# Patient Record
Sex: Female | Born: 1935 | Race: White | Hispanic: No | Smoking: Never smoker
Health system: Southern US, Community
[De-identification: ages and names within clinical notes are randomized; demographics above are authoritative.]

## PROBLEM LIST (undated history)

## (undated) DIAGNOSIS — E119 Type 2 diabetes mellitus without complications: Secondary | ICD-10-CM

## (undated) HISTORY — PX: ABDOMINAL HYSTERECTOMY: SHX81

---

## 2005-03-14 ENCOUNTER — Ambulatory Visit: Payer: Self-pay | Admitting: Family Medicine

## 2005-03-19 ENCOUNTER — Ambulatory Visit: Payer: Self-pay | Admitting: Family Medicine

## 2005-04-12 ENCOUNTER — Ambulatory Visit: Payer: Self-pay | Admitting: Gastroenterology

## 2005-07-09 ENCOUNTER — Ambulatory Visit: Payer: Self-pay | Admitting: General Surgery

## 2007-04-21 ENCOUNTER — Ambulatory Visit: Payer: Self-pay | Admitting: Family Medicine

## 2008-04-21 ENCOUNTER — Ambulatory Visit: Payer: Self-pay | Admitting: Family Medicine

## 2008-08-08 ENCOUNTER — Ambulatory Visit: Payer: Self-pay | Admitting: Family Medicine

## 2008-08-25 ENCOUNTER — Ambulatory Visit: Payer: Self-pay | Admitting: Family Medicine

## 2008-10-18 ENCOUNTER — Ambulatory Visit: Payer: Self-pay | Admitting: Ophthalmology

## 2008-10-31 ENCOUNTER — Ambulatory Visit: Payer: Self-pay | Admitting: Ophthalmology

## 2008-12-19 ENCOUNTER — Ambulatory Visit: Payer: Self-pay | Admitting: Ophthalmology

## 2009-07-20 ENCOUNTER — Ambulatory Visit: Payer: Self-pay | Admitting: Family Medicine

## 2009-11-21 ENCOUNTER — Emergency Department: Payer: Self-pay | Admitting: Emergency Medicine

## 2010-09-20 ENCOUNTER — Ambulatory Visit: Payer: Self-pay | Admitting: Family Medicine

## 2011-10-24 ENCOUNTER — Ambulatory Visit: Payer: Self-pay | Admitting: Family Medicine

## 2012-09-19 ENCOUNTER — Emergency Department: Payer: Self-pay | Admitting: Internal Medicine

## 2012-09-19 LAB — COMPREHENSIVE METABOLIC PANEL
Anion Gap: 11 (ref 7–16)
BUN: 9 mg/dL (ref 7–18)
Calcium, Total: 8.8 mg/dL (ref 8.5–10.1)
Co2: 25 mmol/L (ref 21–32)
EGFR (African American): >60
EGFR (Non-African Amer.): >60
Glucose: 200 mg/dL — ABNORMAL HIGH (ref 65–99)
Osmolality: 258 (ref 275–301)
Potassium: 4.3 mmol/L (ref 3.5–5.1)
SGOT(AST): 14 U/L — ABNORMAL LOW (ref 15–37)
SGPT (ALT): 19 U/L (ref 12–78)
Sodium: 126 mmol/L — ABNORMAL LOW (ref 136–145)
Total Protein: 7.2 g/dL (ref 6.4–8.2)

## 2012-09-19 LAB — CBC
HCT: 35.9 % (ref 35.0–47.0)
MCH: 29.5 pg (ref 26.0–34.0)
MCV: 86 fL (ref 80–100)
Platelet: 256 10*3/uL (ref 150–440)
RDW: 12.4 % (ref 11.5–14.5)
WBC: 4.8 10*3/uL (ref 3.6–11.0)

## 2012-09-19 LAB — TROPONIN I: Troponin-I: <0.02 ng/mL

## 2012-09-19 LAB — CK TOTAL AND CKMB (NOT AT ARMC): CK-MB: 0.7 ng/mL (ref 0.5–3.6)

## 2012-11-11 ENCOUNTER — Ambulatory Visit: Payer: Self-pay | Admitting: Family Medicine

## 2013-11-15 ENCOUNTER — Ambulatory Visit: Payer: Self-pay | Admitting: Family Medicine

## 2014-11-16 ENCOUNTER — Ambulatory Visit: Payer: Self-pay | Admitting: Family Medicine

## 2020-09-04 ENCOUNTER — Inpatient Hospital Stay
Admission: EM | Admit: 2020-09-04 | Discharge: 2020-09-08 | DRG: 637 | Disposition: A | Discharge status: Skilled Nursing Facility | Payer: MEDICARE | Attending: Internal Medicine | Admitting: Internal Medicine

## 2020-09-04 ENCOUNTER — Emergency Department: Payer: MEDICARE

## 2020-09-04 ENCOUNTER — Encounter: Payer: Self-pay | Admitting: Emergency Medicine

## 2020-09-04 ENCOUNTER — Other Ambulatory Visit: Payer: Self-pay

## 2020-09-04 DIAGNOSIS — N179 Acute kidney failure, unspecified: Secondary | ICD-10-CM

## 2020-09-04 DIAGNOSIS — I10 Essential (primary) hypertension: Secondary | ICD-10-CM

## 2020-09-04 DIAGNOSIS — E1110 Type 2 diabetes mellitus with ketoacidosis without coma: Secondary | ICD-10-CM | POA: Diagnosis not present

## 2020-09-04 DIAGNOSIS — E1122 Type 2 diabetes mellitus with diabetic chronic kidney disease: Secondary | ICD-10-CM | POA: Diagnosis present

## 2020-09-04 DIAGNOSIS — R410 Disorientation, unspecified: Secondary | ICD-10-CM

## 2020-09-04 DIAGNOSIS — E785 Hyperlipidemia, unspecified: Secondary | ICD-10-CM | POA: Diagnosis present

## 2020-09-04 DIAGNOSIS — I129 Hypertensive chronic kidney disease with stage 1 through stage 4 chronic kidney disease, or unspecified chronic kidney disease: Secondary | ICD-10-CM | POA: Diagnosis present

## 2020-09-04 DIAGNOSIS — I6381 Other cerebral infarction due to occlusion or stenosis of small artery: Secondary | ICD-10-CM | POA: Diagnosis present

## 2020-09-04 DIAGNOSIS — E1165 Type 2 diabetes mellitus with hyperglycemia: Secondary | ICD-10-CM | POA: Diagnosis present

## 2020-09-04 DIAGNOSIS — E1310 Other specified diabetes mellitus with ketoacidosis without coma: Secondary | ICD-10-CM

## 2020-09-04 DIAGNOSIS — Z7984 Long term (current) use of oral hypoglycemic drugs: Secondary | ICD-10-CM

## 2020-09-04 DIAGNOSIS — N1832 Chronic kidney disease, stage 3b: Secondary | ICD-10-CM | POA: Diagnosis present

## 2020-09-04 DIAGNOSIS — G9341 Metabolic encephalopathy: Secondary | ICD-10-CM | POA: Diagnosis present

## 2020-09-04 DIAGNOSIS — E119 Type 2 diabetes mellitus without complications: Secondary | ICD-10-CM

## 2020-09-04 DIAGNOSIS — Z20822 Contact with and (suspected) exposure to covid-19: Secondary | ICD-10-CM | POA: Diagnosis present

## 2020-09-04 HISTORY — DX: Type 2 diabetes mellitus without complications: E11.9

## 2020-09-04 LAB — URINALYSIS, ROUTINE W REFLEX MICROSCOPIC
Bacteria, UA: NONE SEEN
Bilirubin Urine: NEGATIVE
Glucose, UA: >=500 mg/dL — AB
Hgb urine dipstick: NEGATIVE
Ketones, ur: 80 mg/dL — AB
Leukocytes,Ua: NEGATIVE
Nitrite: NEGATIVE
Protein, ur: NEGATIVE mg/dL
Specific Gravity, Urine: 1.022 (ref 1.005–1.030)
pH: 5 (ref 5.0–8.0)

## 2020-09-04 LAB — COMPREHENSIVE METABOLIC PANEL
ALT: 15 U/L (ref 0–44)
AST: 16 U/L (ref 15–41)
Albumin: 4.1 g/dL (ref 3.5–5.0)
Alkaline Phosphatase: 80 U/L (ref 38–126)
Anion gap: 16 — ABNORMAL HIGH (ref 5–15)
BUN: 31 mg/dL — ABNORMAL HIGH (ref 8–23)
CO2: 21 mmol/L — ABNORMAL LOW (ref 22–32)
Calcium: 9.6 mg/dL (ref 8.9–10.3)
Chloride: 87 mmol/L — ABNORMAL LOW (ref 98–111)
Creatinine, Ser: 1.1 mg/dL — ABNORMAL HIGH (ref 0.44–1.00)
GFR, Estimated: 45 mL/min — ABNORMAL LOW (ref >60)
Glucose, Bld: 662 mg/dL (ref 70–99)
Potassium: 5.4 mmol/L — ABNORMAL HIGH (ref 3.5–5.1)
Sodium: 124 mmol/L — ABNORMAL LOW (ref 135–145)
Total Bilirubin: 1.1 mg/dL (ref 0.3–1.2)
Total Protein: 7.2 g/dL (ref 6.5–8.1)

## 2020-09-04 LAB — GLUCOSE, CAPILLARY
Glucose-Capillary: 106 mg/dL — ABNORMAL HIGH (ref 70–99)
Glucose-Capillary: 142 mg/dL — ABNORMAL HIGH (ref 70–99)
Glucose-Capillary: 152 mg/dL — ABNORMAL HIGH (ref 70–99)
Glucose-Capillary: 193 mg/dL — ABNORMAL HIGH (ref 70–99)
Glucose-Capillary: 206 mg/dL — ABNORMAL HIGH (ref 70–99)
Glucose-Capillary: 233 mg/dL — ABNORMAL HIGH (ref 70–99)
Glucose-Capillary: 399 mg/dL — ABNORMAL HIGH (ref 70–99)
Glucose-Capillary: 520 mg/dL (ref 70–99)
Glucose-Capillary: >600 mg/dL (ref 70–99)

## 2020-09-04 LAB — CBC
HCT: 34.5 % — ABNORMAL LOW (ref 36.0–46.0)
Hemoglobin: 11.7 g/dL — ABNORMAL LOW (ref 12.0–15.0)
MCH: 29.7 pg (ref 26.0–34.0)
MCHC: 33.9 g/dL (ref 30.0–36.0)
MCV: 87.6 fL (ref 80.0–100.0)
Platelets: 241 10*3/uL (ref 150–400)
RBC: 3.94 MIL/uL (ref 3.87–5.11)
RDW: 11.9 % (ref 11.5–15.5)
WBC: 5.8 10*3/uL (ref 4.0–10.5)
nRBC: 0 % (ref 0.0–0.2)

## 2020-09-04 LAB — BLOOD GAS, VENOUS
Acid-base deficit: 5 mmol/L — ABNORMAL HIGH (ref 0.0–2.0)
Bicarbonate: 21.6 mmol/L (ref 20.0–28.0)
O2 Saturation: 50.9 %
Patient temperature: 37
pCO2, Ven: 45 mmHg (ref 44.0–60.0)
pH, Ven: 7.29 (ref 7.250–7.430)
pO2, Ven: 31 mmHg — CL (ref 32.0–45.0)

## 2020-09-04 LAB — TROPONIN I (HIGH SENSITIVITY)
Troponin I (High Sensitivity): 11 ng/L (ref <18)
Troponin I (High Sensitivity): 12 ng/L (ref <18)

## 2020-09-04 LAB — RESPIRATORY PANEL BY RT PCR (FLU A&B, COVID)
Influenza A by PCR: NEGATIVE
Influenza B by PCR: NEGATIVE
SARS Coronavirus 2 by RT PCR: NEGATIVE

## 2020-09-04 LAB — BASIC METABOLIC PANEL
Anion gap: 16 — ABNORMAL HIGH (ref 5–15)
BUN: 20 mg/dL (ref 8–23)
CO2: 22 mmol/L (ref 22–32)
Calcium: 8.8 mg/dL — ABNORMAL LOW (ref 8.9–10.3)
Chloride: 97 mmol/L — ABNORMAL LOW (ref 98–111)
Creatinine, Ser: 0.62 mg/dL (ref 0.44–1.00)
GFR, Estimated: >60 mL/min (ref >60)
Glucose, Bld: 102 mg/dL — ABNORMAL HIGH (ref 70–99)
Potassium: 3.5 mmol/L (ref 3.5–5.1)
Sodium: 135 mmol/L (ref 135–145)

## 2020-09-04 LAB — MAGNESIUM: Magnesium: 2.2 mg/dL (ref 1.7–2.4)

## 2020-09-04 LAB — BETA-HYDROXYBUTYRIC ACID: Beta-Hydroxybutyric Acid: 4.21 mmol/L — ABNORMAL HIGH (ref 0.05–0.27)

## 2020-09-04 MED ORDER — AMLODIPINE BESYLATE 5 MG PO TABS
5.0000 mg | ORAL_TABLET | Freq: Every day | ORAL | Status: DC
  Administered 2020-09-04 – 2020-09-08 (×5): 5 mg via ORAL
  Filled 2020-09-04 (×5): qty 1

## 2020-09-04 MED ORDER — LACTATED RINGERS IV SOLN: INTRAVENOUS | Status: DC

## 2020-09-04 MED ORDER — SIMVASTATIN 20 MG PO TABS
20.0000 mg | ORAL_TABLET | Freq: Every day | ORAL | Status: DC
  Administered 2020-09-04 – 2020-09-08 (×5): 20 mg via ORAL
  Filled 2020-09-04 (×2): qty 1
  Filled 2020-09-04 (×2): qty 2
  Filled 2020-09-04: qty 1

## 2020-09-04 MED ORDER — INSULIN REGULAR(HUMAN) IN NACL 100-0.9 UT/100ML-% IV SOLN
INTRAVENOUS | Status: DC
  Administered 2020-09-04: 7 [IU]/h via INTRAVENOUS
  Filled 2020-09-04: qty 100

## 2020-09-04 MED ORDER — LACTATED RINGERS IV BOLUS
1000.0000 mL | Freq: Once | INTRAVENOUS | Status: AC
  Administered 2020-09-04: 1000 mL via INTRAVENOUS

## 2020-09-04 MED ORDER — DEXTROSE 50 % IV SOLN: 0.0000 mL | INTRAVENOUS | Status: DC | PRN

## 2020-09-04 MED ORDER — ENOXAPARIN SODIUM 40 MG/0.4ML ~~LOC~~ SOLN
40.0000 mg | SUBCUTANEOUS | Status: DC
  Administered 2020-09-04 – 2020-09-07 (×4): 40 mg via SUBCUTANEOUS
  Filled 2020-09-04 (×3): qty 0.4

## 2020-09-04 MED ORDER — DEXTROSE IN LACTATED RINGERS 5 % IV SOLN: INTRAVENOUS | Status: DC

## 2020-09-04 MED ORDER — LACTATED RINGERS IV BOLUS
20.0000 mL/kg | Freq: Once | INTRAVENOUS | Status: AC
  Administered 2020-09-04: 952 mL via INTRAVENOUS

## 2020-09-04 NOTE — ED Notes (Signed)
Pt placed on 2L [place]

## 2020-09-04 NOTE — ED Triage Notes (Signed)
Pt in via EMS from home with c/o increased confusion over the last week. Pt family lives in [place], business card for pastor for contact.  fsbs 595, takes metformin only, HR 80's, 150/80, hx of HTN, has not taken meds today

## 2020-09-04 NOTE — ED Provider Notes (Signed)
[facility] Emergency Department Provider Note    ____________________________________________   I have reviewed the triage vital signs and the nursing notes.   HISTORY  Chief Complaint Altered Mental Status   History limited by: Not Limited   HPI Kristi Payne is a 86 y.o. female who presents to the emergency department today because of concern for altered mental status. Out of state family noticed that the patient did not sound like herself yesterday. They are concerned that she might not have been herself for the past few days. Patient does state that she might have forgotten to take her diabetes medications yesterday. She denies any recent illness. Denies any shortness of breath or cough. Denies any change in urination.    Records reviewed. Per medical record review patient has a history of DM  Past Medical History:  Diagnosis Date  . Diabetes mellitus without complication (HCC)     There are no problems to display for this patient.   Past Surgical History:  Procedure Laterality Date  . ABDOMINAL HYSTERECTOMY      Prior to Admission medications   Medication Sig Start Date End Date Taking? Authorizing Provider  metFORMIN (GLUCOPHAGE) 1000 MG tablet Take 1,000 mg by mouth 2 (two) times daily. 08/14/20  Yes [provider]    Allergies Penicillins  No family history on file.  Social History Social History   Tobacco Use  . Smoking status: Never Smoker  . Smokeless tobacco: Never Used  Substance Use Topics  . Alcohol use: Yes    Comment: Socially  . Drug use: Not Currently    Review of Systems Constitutional: No fever/chills Eyes: No visual changes. ENT: No sore throat. Cardiovascular: Denies chest pain. Respiratory: Denies shortness of breath. Gastrointestinal: No abdominal pain.  No nausea, no vomiting.  No diarrhea.   Genitourinary: Negative for dysuria. Musculoskeletal: Negative for back pain. Skin: Negative for  rash. Neurological: Positive for confusion.  ____________________________________________   PHYSICAL EXAM:  VITAL SIGNS: ED Triage Vitals  Enc Vitals Group     BP 09/04/20 1121 (!) 150/53     Pulse Rate 09/04/20 1121 82     Resp 09/04/20 1121 18     Temp 09/04/20 1121 98.2 F (36.8 C)     Temp Source 09/04/20 1121 Oral     SpO2 09/04/20 1121 95 %     Weight 09/04/20 1122 105 lb (47.6 kg)     Height 09/04/20 1122 5' (1.524 m)     Head Circumference --      Peak Flow --      Pain Score 09/04/20 1132 0   Constitutional: Alert and oriented.  Eyes: Conjunctivae are normal.  ENT      Head: Normocephalic and atraumatic.      Nose: No congestion/rhinnorhea.      Mouth/Throat: Mucous membranes are moist.      Neck: No stridor. Hematological/Lymphatic/Immunilogical: No cervical lymphadenopathy. Cardiovascular: Normal rate, regular rhythm.  No murmurs, rubs, or gallops.  Respiratory: Normal respiratory effort without tachypnea nor retractions. Breath sounds are clear and equal bilaterally. No wheezes/rales/rhonchi. Gastrointestinal: Soft and non tender. No rebound. No guarding.  Genitourinary: Deferred Musculoskeletal: Normal range of motion in all extremities. No lower extremity edema. Neurologic:  Normal speech and language. No gross focal neurologic deficits are appreciated.  Skin:  Skin is warm, dry and intact. No rash noted. Psychiatric: Mood and affect are normal. Speech and behavior are normal. Patient exhibits appropriate insight and judgment.  ____________________________________________  LABS (pertinent positives/negatives)  Beta-hydroxybutyric acid 4.21 VBG pH 7.29 CMP na 124, k 5.4, glu 662, cr 1.10 CBC wbc 5.8, hgb 11.7, plt 241  ____________________________________________   EKG  None  ____________________________________________    RADIOLOGY  CT head Lacunar infarct that is subacute or chronic  CXR Pulmonary  nodule  ____________________________________________   PROCEDURES  Procedures  CRITICAL CARE Performed by: Phineas Semen   Total critical care time: 25 minutes  Critical care time was exclusive of separately billable procedures and treating other patients.  Critical care was necessary to treat or prevent imminent or life-threatening deterioration.  Critical care was time spent personally by me on the following activities: development of treatment plan with patient and/or surrogate as well as nursing, discussions with consultants, evaluation of patient's response to treatment, examination of patient, obtaining history from patient or surrogate, ordering and performing treatments and interventions, ordering and review of laboratory studies, ordering and review of radiographic studies, pulse oximetry and re-evaluation of patient's condition.  ____________________________________________   INITIAL IMPRESSION / ASSESSMENT AND PLAN / ED COURSE  Pertinent labs & imaging results that were available during my care of the patient were reviewed by me and considered in my medical decision making (see chart for details).   Patient presented to the emergency department today because of concerns for altered mental status noted by family over the telephone.  On exam patient is awake and alert.  She is in no distress.  Blood work however is concerning for possible DKA.  Patient has high blood sugar, elevated anion gap, pH of 7.29 and elevated ketones.  Because of this I do feel patient would benefit from admission and IV insulin.  Discussed this finding with the patient and daughter over the telephone. ____________________________________________   FINAL CLINICAL IMPRESSION(S) / ED DIAGNOSES  Final diagnoses:  Diabetic ketoacidosis without coma associated with other specified diabetes mellitus (HCC)  Confusion     Note: This dictation was prepared with Dragon dictation. Any transcriptional  errors that result from this process are unintentional     Phineas Semen, MD 09/04/20 [id number]

## 2020-09-04 NOTE — ED Triage Notes (Signed)
Pt presents to ED via [facility] with c/o increasing confusion. Pt alert oriented to self, situation, place, disoriented to time. Per EMS CBG over 500. Pt appears in NAD while in triage. Denies pain at this time. Pt states hx of DM at this time.   In triage CBG reading "HI".

## 2020-09-04 NOTE — ED Triage Notes (Signed)
Kristi Payne business card information.  [name] [phone]

## 2020-09-04 NOTE — H&P (Signed)
History and Physical    Kristi Payne WUJ:[id number] DOB: 15-Mar-1930 DOA: 09/04/2020  PCP: Jerl Mina, MD  Patient coming from: Home  I have personally briefly reviewed patient's old medical records in [facility]  Chief Complaint: Altered mental status  HPI: Kristi Payne is a 86 y.o. female with medical history significant for uncontrolled type 2 diabetes, hypertension hyperlipidemia who presents with concerns of altered mental status.  Reportedly patient was brought in by her pastor for concerns of altered mental status.  Patient reports that she has only been feeling dizzy for the past few days and has been feeling unsteady.  She denies any nausea, vomiting or diarrhea.  Denies any decreased p.o. intake.  States she has been compliant with her medication.  Denies any tobacco, alcohol or illicit drug use.  ED Course:  Afebrile, normotensive on room air.  Blood glucose notably elevated to 662, anion gap 16, pH of 7.29, bicarb of 21.  Has an AKI with creatinine of 1.10. No leukocytosis with mild anemia with hemoglobin of 1.7.  Review of Systems: Constitutional: No Weight Change, No Fever ENT/Mouth: No sore throat, No Rhinorrhea Eyes: No Eye Pain, No Vision Changes Cardiovascular: No Chest Pain, no SOB Respiratory: No Cough, No Sputum Gastrointestinal: No Nausea, No Vomiting Genitourinary: no Urinary Incontinence, No Urgency, No Flank Pain Musculoskeletal: No Arthralgias, No Myalgias Skin: No Skin Lesions, No Pruritus, Neuro: no Weakness, No Numbness,  No Loss of Consciousness, No Syncope Psych: No Anxiety/Panic, No Depression, no decrease appetite Heme/Lymph: No Bruising, No Bleeding  Past Medical History:  Diagnosis Date  . Diabetes mellitus without complication [facility])     Past Surgical History:  Procedure Laterality Date  . ABDOMINAL HYSTERECTOMY       reports that she has never smoked. She has never used smokeless tobacco. She reports current alcohol use. She  reports previous drug use. Social History  Allergies  Allergen Reactions  . Penicillins     Other reaction(s): Unknown    No family history on file.   Prior to Admission medications   Medication Sig Start Date End Date Taking? Authorizing Provider  amLODipine (NORVASC) 5 MG tablet Take 5 mg by mouth daily.   Yes [provider]  hydrochlorothiazide (HYDRODIURIL) 25 MG tablet Take 25 mg by mouth daily.   Yes [provider]  lisinopril (ZESTRIL) 40 MG tablet Take 40 mg by mouth daily.   Yes [provider]  metFORMIN (GLUCOPHAGE) 1000 MG tablet Take 1,000 mg by mouth 2 (two) times daily. 08/14/20  Yes [provider]  simvastatin (ZOCOR) 20 MG tablet Take 20 mg by mouth daily.   Yes [provider]    Physical Exam: Vitals:   09/04/20 1122 09/04/20 1449 09/04/20 1700 09/04/20 1730  BP:  (!) 146/66 (!) 151/61 (!) 140/46  Pulse:  70 69 69  Resp:  16    Temp:  98.8 F (37.1 C)    TempSrc:  Oral    SpO2:  100% 98% 98%  Weight: 47.6 kg     Height: 5' (1.524 m)       Constitutional: NAD, calm, comfortable, nontoxic but disheveled appearing thin elderly female lying flat in bed Vitals:   09/04/20 1122 09/04/20 1449 09/04/20 1700 09/04/20 1730  BP:  (!) 146/66 (!) 151/61 (!) 140/46  Pulse:  70 69 69  Resp:  16    Temp:  98.8 F (37.1 C)    TempSrc:  Oral    SpO2:  100% 98%  98%  Weight: 47.6 kg     Height: 5' (1.524 m)      Eyes: PERRL, lids and conjunctivae normal ENMT: Mucous membranes are moist. ] Neck: normal, supple, no masses, no thyromegaly Respiratory: clear to auscultation bilaterally, no wheezing, no crackles. Normal respiratory effort. No accessory muscle use.  Cardiovascular: Regular rate and rhythm, no murmurs / rubs / gallops. No extremity edema.   Abdomen: no tenderness, no masses palpated. No hepatosplenomegaly. Bowel sounds positive.  Musculoskeletal: no clubbing / cyanosis. No joint deformity upper and lower  extremities. Good ROM, no contractures. Normal muscle tone.  Skin: no rashes, lesions, ulcers. No induration Neurologic: CN 2-12 grossly intact. Sensation intact,Strength 5/5 in all 4.  Psychiatric: Normal judgment and insight. Alert and oriented x 3. Normal mood.     Labs on Admission: I have personally reviewed following labs and imaging studies  CBC: Recent Labs  Lab 09/04/20 1133  WBC 5.8  HGB 11.7*  HCT 34.5*  MCV 87.6  PLT 241   Basic Metabolic Panel: Recent Labs  Lab 09/04/20 1133 09/04/20 1145  NA 124*  --   K 5.4*  --   CL 87*  --   CO2 21*  --   GLUCOSE 662*  --   BUN 31*  --   CREATININE 1.10*  --   CALCIUM 9.6  --   MG  --  2.2   GFR: Estimated Creatinine Clearance: 25.4 mL/min (A) (by C-G formula based on SCr of 1.1 mg/dL (H)). Liver Function Tests: Recent Labs  Lab 09/04/20 1133  AST 16  ALT 15  ALKPHOS 80  BILITOT 1.1  PROT 7.2  ALBUMIN 4.1   No results for input(s): LIPASE, AMYLASE in the last 168 hours. No results for input(s): AMMONIA in the last 168 hours. Coagulation Profile: No results for input(s): INR, PROTIME in the last 168 hours. Cardiac Enzymes: No results for input(s): CKTOTAL, CKMB, CKMBINDEX, TROPONINI in the last 168 hours. BNP (last 3 results) No results for input(s): PROBNP in the last 8760 hours. HbA1C: No results for input(s): HGBA1C in the last 72 hours. CBG: Recent Labs  Lab 09/04/20 1325 09/04/20 1650 09/04/20 1829 09/04/20 1938 09/04/20 2024  GLUCAP 520* 399* 193* 152* 106*   Lipid Profile: No results for input(s): CHOL, HDL, LDLCALC, TRIG, CHOLHDL, LDLDIRECT in the last 72 hours. Thyroid Function Tests: No results for input(s): TSH, T4TOTAL, FREET4, T3FREE, THYROIDAB in the last 72 hours. Anemia Panel: No results for input(s): VITAMINB12, FOLATE, FERRITIN, TIBC, IRON, RETICCTPCT in the last 72 hours. Urine analysis:    Component Value Date/Time   COLORURINE STRAW (A) 09/04/2020 1733   APPEARANCEUR  CLEAR (A) 09/04/2020 1733   LABSPEC 1.022 09/04/2020 1733   PHURINE 5.0 09/04/2020 1733   GLUCOSEU >=500 (A) 09/04/2020 1733   HGBUR NEGATIVE 09/04/2020 1733   BILIRUBINUR NEGATIVE 09/04/2020 1733   KETONESUR 80 (A) 09/04/2020 1733   PROTEINUR NEGATIVE 09/04/2020 1733   NITRITE NEGATIVE 09/04/2020 1733   LEUKOCYTESUR NEGATIVE 09/04/2020 1733    Radiological Exams on Admission: DG Chest 2 View  Result Date: 09/04/2020 CLINICAL DATA:  Shortness of breath. Additional history provided: Increasing confusion. EXAM: CHEST - 2 VIEW COMPARISON:  Report from prior chest radiographs 11/23/2018 (images unavailable). Chest radiographs 09/19/2012. FINDINGS: Heart size within normal limits.  Aortic atherosclerosis. New from the prior examination of 09/19/2012, there is a subcentimeter nodular opacity which projects in the region of the right lung apex. There is no appreciable airspace consolidation or pulmonary  edema. No evidence of pleural effusion or pneumothorax. No acute bony abnormality identified.  Thoracic spondylosis IMPRESSION: A subcentimeter nodular opacity projects in the region of the right lung apex. Nonemergent chest CT is recommended to further assess for a possible pulmonary nodule at this site. No appreciable airspace consolidation or pulmonary edema. Aortic Atherosclerosis (ICD10-I70.0). Electronically Signed   By: Jackey Loge DO   On: 09/04/2020 12:43   CT Head Wo Contrast  Result Date: 09/04/2020 CLINICAL DATA:  Mental status change, unknown cause. Additional history provided: Confusion x1 week. EXAM: CT HEAD WITHOUT CONTRAST TECHNIQUE: Contiguous axial images were obtained from the base of the skull through the vertex without intravenous contrast. COMPARISON:  No pertinent prior exams are available for comparison. FINDINGS: Brain: Mild generalized parenchymal atrophy of the brain. Lacunar infarct within the left basal ganglia which is subacute or chronic in appearance (series 2, image  10) (series 4, image 26). Background mild ill-defined hypoattenuation within the cerebral white matter is nonspecific, but consistent with chronic small vessel ischemic disease. There is no acute intracranial hemorrhage. No demarcated cortical infarct. No extra-axial fluid collection. No evidence of intracranial mass. No midline shift. Vascular: No hyperdense vessel.  Atherosclerotic calcifications. Skull: Normal. Negative for fracture or focal lesion. Sinuses/Orbits: Visualized orbits show no acute finding. No significant paranasal sinus disease or mastoid effusion at the imaged levels. IMPRESSION: There is a lacunar infarct within the left basal ganglia which is subacute or chronic in appearance. Background mild generalized atrophy of the brain and chronic small vessel ischemic disease. Electronically Signed   By: Jackey Loge DO   On: 09/04/2020 12:48      Assessment/Plan  Hyperglycemia in the setting of uncontrolled type 2 diabetes pH of 7.29, BG of 662, anion gap of 16 and bicarb 20 Hemoglobin A1c of 10.3 in June 2021 continue insulin gtt with goal of 140-180 and AG <12 IV NS until BG <250, then switch to D5 1/2 NS  BMP q4hr  keep NPO   AKI Presented with creatinine of 1.10 Repeat creatinine in the morning after getting fluids  Hypertension Continue amlodipine Hold HCTZ, lisinopril with AKI  Hyperlipidemia Continue statin  DVT prophylaxis:.Lovenox Code Status: Full Family Communication: Plan discussed with patient at bedside  disposition Plan: Home with observation Consults called:  Admission status: Observation  Status is: Observation  The patient remains OBS appropriate and will d/c before 2 midnights.  Dispo: The patient is from: Home              Anticipated d/c is to: Home              Anticipated d/c date is: 1 day              Patient currently is not medically stable to d/c.         Anselm Jungling DO [facility]   If 7PM-7AM, please contact  night-coverage [email or web address]   09/04/2020, 8:34 PM

## 2020-09-05 DIAGNOSIS — I129 Hypertensive chronic kidney disease with stage 1 through stage 4 chronic kidney disease, or unspecified chronic kidney disease: Secondary | ICD-10-CM | POA: Diagnosis present

## 2020-09-05 DIAGNOSIS — E1169 Type 2 diabetes mellitus with other specified complication: Secondary | ICD-10-CM | POA: Diagnosis not present

## 2020-09-05 DIAGNOSIS — Z794 Long term (current) use of insulin: Secondary | ICD-10-CM

## 2020-09-05 DIAGNOSIS — E1165 Type 2 diabetes mellitus with hyperglycemia: Secondary | ICD-10-CM | POA: Diagnosis not present

## 2020-09-05 DIAGNOSIS — E1110 Type 2 diabetes mellitus with ketoacidosis without coma: Secondary | ICD-10-CM | POA: Diagnosis present

## 2020-09-05 DIAGNOSIS — E785 Hyperlipidemia, unspecified: Secondary | ICD-10-CM | POA: Diagnosis present

## 2020-09-05 DIAGNOSIS — I10 Essential (primary) hypertension: Secondary | ICD-10-CM | POA: Diagnosis not present

## 2020-09-05 DIAGNOSIS — E119 Type 2 diabetes mellitus without complications: Secondary | ICD-10-CM

## 2020-09-05 DIAGNOSIS — E1122 Type 2 diabetes mellitus with diabetic chronic kidney disease: Secondary | ICD-10-CM | POA: Diagnosis present

## 2020-09-05 DIAGNOSIS — R410 Disorientation, unspecified: Secondary | ICD-10-CM | POA: Diagnosis present

## 2020-09-05 DIAGNOSIS — G9341 Metabolic encephalopathy: Secondary | ICD-10-CM

## 2020-09-05 DIAGNOSIS — Z20822 Contact with and (suspected) exposure to covid-19: Secondary | ICD-10-CM | POA: Diagnosis present

## 2020-09-05 DIAGNOSIS — N1832 Chronic kidney disease, stage 3b: Secondary | ICD-10-CM | POA: Diagnosis present

## 2020-09-05 DIAGNOSIS — N179 Acute kidney failure, unspecified: Secondary | ICD-10-CM | POA: Diagnosis present

## 2020-09-05 DIAGNOSIS — I6381 Other cerebral infarction due to occlusion or stenosis of small artery: Secondary | ICD-10-CM | POA: Diagnosis present

## 2020-09-05 DIAGNOSIS — Z7984 Long term (current) use of oral hypoglycemic drugs: Secondary | ICD-10-CM | POA: Diagnosis not present

## 2020-09-05 LAB — BASIC METABOLIC PANEL
Anion gap: 8 (ref 5–15)
BUN: 16 mg/dL (ref 8–23)
CO2: 27 mmol/L (ref 22–32)
Calcium: 8.6 mg/dL — ABNORMAL LOW (ref 8.9–10.3)
Chloride: 98 mmol/L (ref 98–111)
Creatinine, Ser: 0.59 mg/dL (ref 0.44–1.00)
GFR, Estimated: >60 mL/min (ref >60)
Glucose, Bld: 149 mg/dL — ABNORMAL HIGH (ref 70–99)
Potassium: 3.8 mmol/L (ref 3.5–5.1)
Sodium: 133 mmol/L — ABNORMAL LOW (ref 135–145)

## 2020-09-05 LAB — GLUCOSE, CAPILLARY
Glucose-Capillary: 138 mg/dL — ABNORMAL HIGH (ref 70–99)
Glucose-Capillary: 141 mg/dL — ABNORMAL HIGH (ref 70–99)
Glucose-Capillary: 152 mg/dL — ABNORMAL HIGH (ref 70–99)
Glucose-Capillary: 171 mg/dL — ABNORMAL HIGH (ref 70–99)
Glucose-Capillary: 186 mg/dL — ABNORMAL HIGH (ref 70–99)
Glucose-Capillary: 192 mg/dL — ABNORMAL HIGH (ref 70–99)
Glucose-Capillary: 211 mg/dL — ABNORMAL HIGH (ref 70–99)
Glucose-Capillary: 223 mg/dL — ABNORMAL HIGH (ref 70–99)
Glucose-Capillary: 331 mg/dL — ABNORMAL HIGH (ref 70–99)
Glucose-Capillary: 373 mg/dL — ABNORMAL HIGH (ref 70–99)

## 2020-09-05 MED ORDER — INSULIN GLARGINE 100 UNIT/ML ~~LOC~~ SOLN
8.0000 [IU] | Freq: Every day | SUBCUTANEOUS | Status: DC
  Administered 2020-09-05: 8 [IU] via SUBCUTANEOUS
  Filled 2020-09-05 (×2): qty 0.08

## 2020-09-05 MED ORDER — INSULIN ASPART 100 UNIT/ML ~~LOC~~ SOLN
0.0000 [IU] | Freq: Three times a day (TID) | SUBCUTANEOUS | Status: DC
  Administered 2020-09-05: 7 [IU] via SUBCUTANEOUS
  Administered 2020-09-05: 9 [IU] via SUBCUTANEOUS
  Administered 2020-09-06 (×2): 3 [IU] via SUBCUTANEOUS
  Administered 2020-09-06: 9 [IU] via SUBCUTANEOUS
  Administered 2020-09-07 (×2): 5 [IU] via SUBCUTANEOUS
  Administered 2020-09-07: 7 [IU] via SUBCUTANEOUS
  Filled 2020-09-05 (×8): qty 1

## 2020-09-05 MED ORDER — LISINOPRIL 20 MG PO TABS
40.0000 mg | ORAL_TABLET | Freq: Every day | ORAL | Status: DC
  Administered 2020-09-05 – 2020-09-08 (×4): 40 mg via ORAL
  Filled 2020-09-05: qty 4
  Filled 2020-09-05 (×3): qty 2

## 2020-09-05 NOTE — Evaluation (Addendum)
Physical Therapy Evaluation Patient Details Name: Kristi Payne MRN: [id number] DOB: March 21, 1930 Today's Date: 09/05/2020   History of Present Illness  86 y.o. female with medical history significant for uncontrolled type 2 diabetes, hypertension hyperlipidemia who presents with concerns of altered mental status. Blood glucose of 662 on arrival.  CT notes subacute CVA.  Clinical Impression  Pt pleasantly confused t/o the session.  She was eager to participate with PT but needed constant redirection to remain on task and even with this struggled with even relatively simple cues.  She displayed good confidence but ultimately this was misplaced as she had numerous stagger steps during ambulation, was unable to organize sitting back onto bed appropriately and swinging legs around and needed consistent cues simply to stay somewhat on task. Per discussion with daughter (lives in [place] but calls [facility] pt recently started having change in speech and increased confusion in the last few weeks, but that she has managed driving, living alone, etc.  Pt is in no way safe to maintain this at this time, recommending STR at discharge as she is far from her baseline, unable to care for herself and is unsafe for even limited in-home distances walking.  Will consider introducing AD (SPC or RW).    Follow Up Recommendations SNF;Supervision/Assistance - 24 hour    Equipment Recommendations  Rolling walker with 5" wheels (per progress at next venue of care)    Recommendations for Other Services       Precautions / Restrictions Precautions Precautions: Fall Restrictions Weight Bearing Restrictions: No      Mobility  Bed Mobility Overal bed mobility: Needs Assistance Bed Mobility: Supine to Sit;Sit to Supine     Supine to sit: Min assist Sit to supine: Min assist   General bed mobility comments: Needs repeated cuing to begin movement to getting to EOB.  Able to get herself to sitting w/o assist, unable  to organize returning to bed even with repeat cuing and multiple efforts to positiong appropriately in multiple planes (pt initially laying across the foot of bed initially and repeatedly on attempts to stand up and get positioned more appropriately in bed)  Transfers Overall transfer level: Needs assistance Equipment used: 1 person hand held assist Transfers: Sit to/from Stand Sit to Stand: Min guard Stand pivot transfers: Min guard       General transfer comment: Pt was able to rise w/o phyiscal assist, but was impulsive and showed general lack of awareness  Ambulation/Gait Ambulation/Gait assistance: Min assist Gait Distance (Feet): 175 Feet Assistive device: None       General Gait Details: Pt with consistent veering to the L that she did typically manage without direct assist (though often did use UE to stabilize) but did need PT intervention on 2 occasions to maintain upright.  Her O2 remained in the high 90s on room air and she did not endorse fatigue however she showed poor awareness and despite cuing that she might need to use AD she showed little awareness regarding this  Stairs            Wheelchair Mobility    Modified Rankin (Stroke Patients Only)       Balance Overall balance assessment: Needs assistance Sitting-balance support: Feet unsupported Sitting balance-Leahy Scale: Good Sitting balance - Comments: poor awareness, but able to maintain sitting balance w/o issue   Standing balance support: During functional activity Standing balance-Leahy Scale: Fair Standing balance comment: stagger stepping and L leaning in standing  Pertinent Vitals/Pain Pain Assessment: No/denies pain    Home Living Family/patient expects to be discharged to:: Private residence Living Arrangements: Alone Available Help at Discharge: Friend(s);Available PRN/intermittently Type of Home: House Home Access: Stairs to enter Entrance  Stairs-Rails: None Entrance Stairs-Number of Steps: 2 Home Layout: One level Home Equipment:  (pt states she does not have) Additional Comments: per pt: daughter can be there 99.5% of the time, in speaking with daughter post session she lives in [place] and hasn't seen mom in over a year...    Prior Function Level of Independence: Independent         Comments: obsensibly pt is able to get around in the home w/o issue, but does have regular check-ins from family and friends     Hand Dominance   Dominant Hand: Right    Extremity/Trunk Assessment   Upper Extremity Assessment Upper Extremity Assessment: Overall WFL for tasks assessed;Generalized weakness;Difficult to assess due to impaired cognition    Lower Extremity Assessment Lower Extremity Assessment: Overall WFL for tasks assessed;Generalized weakness;Difficult to assess due to impaired cognition       Communication   Communication: No difficulties  Cognition Arousal/Alertness: Awake/alert Behavior During Therapy: Impulsive;Restless Overall Cognitive Status: No family/caregiver present to determine baseline cognitive functioning Area of Impairment: Orientation;Attention;Following commands;Safety/judgement;Awareness;Problem solving;Memory                 Orientation Level: Person;Place Current Attention Level: Sustained Memory: Decreased short-term memory Following Commands: Follows one step commands consistently;Follows one step commands with increased time Safety/Judgement: Decreased awareness of safety;Decreased awareness of deficits Awareness: Intellectual Problem Solving: Slow processing;Requires verbal cues General Comments: Pt with tangential speech this session and needing mod cuing to attend to task. Pt verbalized she was in hospital but was not sure why. Very impulsive with movement       General Comments      Exercises     Assessment/Plan    PT Assessment Patient needs continued PT services   PT Problem List Decreased strength;Decreased range of motion;Decreased activity tolerance;Decreased balance;Decreased mobility;Decreased coordination;Decreased cognition;Decreased safety awareness;Decreased knowledge of use of DME;Decreased knowledge of precautions       PT Treatment Interventions DME instruction;Gait training;Stair training;Functional mobility training;Therapeutic activities;Therapeutic exercise;Balance training;Neuromuscular re-education;Cognitive remediation;Patient/family education    PT Goals (Current goals can be found in the Care Plan section)  Acute Rehab PT Goals Patient Stated Goal: to go home PT Goal Formulation: With patient Time For Goal Achievement: 09/19/20 Potential to Achieve Goals: Fair    Frequency Min 2X/week   Barriers to discharge        Co-evaluation               AM-PAC PT "6 Clicks" Mobility  Outcome Measure Help needed turning from your back to your side while in a flat bed without using bedrails?: A Little Help needed moving from lying on your back to sitting on the side of a flat bed without using bedrails?: A Little Help needed moving to and from a bed to a chair (including a wheelchair)?: A Little Help needed standing up from a chair using your arms (e.g., wheelchair or bedside chair)?: A Little Help needed to walk in hospital room?: A Little Help needed climbing 3-5 steps with a railing? : A Lot 6 Click Score: 17    End of Session Equipment Utilized During Treatment: Gait belt Activity Tolerance: Patient tolerated treatment well Patient left: in bed;with call bell/phone within reach Nurse Communication: Mobility status (instability with ambulation) PT Visit  Diagnosis: Muscle weakness (generalized) (M62.81);Difficulty in walking, not elsewhere classified (R26.2)    Time: 1039-1100 PT Time Calculation (min) (ACUTE ONLY): 21 min   Charges:   PT Evaluation $PT Eval Low Complexity: 1 Low PT Treatments $Gait Training:  8-22 mins        Malachi Pro, DPT 09/05/2020, 1:30 PM

## 2020-09-05 NOTE — Progress Notes (Signed)
Inpatient Diabetes Program Recommendations  AACE/ADA: New Consensus Statement on Inpatient Glycemic Control (2015)  Target Ranges:  Prepandial:   less than 140 mg/dL      Peak postprandial:   less than 180 mg/dL (1-2 hours)      Critically ill patients:  140 - 180 mg/dL   Lab Results  Component Value Date   GLUCAP 141 (H) 09/05/2020    Review of Glycemic Control  Diabetes history: DM2 Outpatient Diabetes medications: Metformin 1 gm bid Current orders for Inpatient glycemic control: Transitioned off of IV insulin to Lantus 8 units + Novolog sensitive correction tid  Inpatient Diabetes Program Recommendations:   Agree with plan to start on Lantus 8 units now and daily while in the hospital and may need @ discharge home. A1c pending. Will follow during hospitalization.  Thank you, Billy Fischer. Delford Wingert, RN, MSN, CDE  Diabetes Coordinator Inpatient Glycemic Control Team Team Pager [phone] (8am-5pm) 09/05/2020 11:00 AM

## 2020-09-05 NOTE — ED Notes (Signed)
Admitting notified per [facility] prompt for "glucose stable".

## 2020-09-05 NOTE — Progress Notes (Signed)
PROGRESS NOTE    Kristi Payne  GEX:[id number] DOB: July 11, 1930 DOA: 09/04/2020 PCP: Jerl Mina, [place]   Assessment & Plan:   Active Problems:   Hyperglycemia due to diabetes mellitus (HCC)   DM2: poorly controlled w/ hyperglycemia. HbA1c in June was 10.3. D/c insulin drip and start lantus, SSI w/ accuchecks. DM coordinator consulted  Encephalopathic: intermittent, etiology unclear. Possible secondary subacute or chronic left basal ganglia lacunar infarct as per CT scan. OT recs home health and PT recs SNF. Repeating the same thing over and over as per pt's daughter   AKI: resolved  HTN: continue on amlodipine, lisinopril. Hold HCTZ   HLD: continue statin    DVT prophylaxis: lovenox  Code Status: full  Family Communication: discussed pt's care w/ pt's daughter, Elease Hashimoto, via phone and answered her questions. Elease Hashimoto lives in [place] and will driving down to [place] tomorrow  Disposition Plan: likely d/c to SNF   Status is: Inpatient  Remains inpatient appropriate because:Unsafe d/c plan and IV treatments appropriate due to intensity of illness or inability to take PO   Dispo: The patient is from: Home              Anticipated d/c is to: SNF              Anticipated d/c date is: 2 days              Patient currently is not medically stable to d/c.     Consultants:      Procedures:    Antimicrobials:    Subjective: Pt c/o malaise   Objective: Vitals:   09/05/20 0300 09/05/20 0330 09/05/20 0400 09/05/20 0707  BP: (!) 108/54 (!) 108/52 (!) 111/55 (!) 126/94  Pulse: 69 71 67 81  Resp:   16   Temp:      TempSrc:      SpO2: 97% 94% 93% 95%  Weight:      Height:       No intake or output data in the 24 hours ending 09/05/20 0804 Filed Weights   09/04/20 1122  Weight: 47.6 kg    Examination:  General exam: Appears calm and comfortable. Respiratory system: Clear to auscultation. Respiratory effort normal. Cardiovascular system: S1 & S2 +. No rubs,  gallops or clicks.  Gastrointestinal system: Abdomen is nondistended, soft and nontender.Hypoactive bowel sounds heard. Central nervous system: Alert and oriented. Moves all 4 extremities  Psychiatry: Judgement and insight appear normal. Flat mood and affect.     Data Reviewed: I have personally reviewed following labs and imaging studies  CBC: Recent Labs  Lab 09/04/20 1133  WBC 5.8  HGB 11.7*  HCT 34.5*  MCV 87.6  PLT 241   Basic Metabolic Panel: Recent Labs  Lab 09/04/20 1133 09/04/20 1145 09/04/20 2042 09/05/20 0408  NA 124*  --  135 133*  K 5.4*  --  3.5 3.8  CL 87*  --  97* 98  CO2 21*  --  22 27  GLUCOSE 662*  --  102* 149*  BUN 31*  --  20 16  CREATININE 1.10*  --  0.62 0.59  CALCIUM 9.6  --  8.8* 8.6*  MG  --  2.2  --   --    GFR: Estimated Creatinine Clearance: 34.9 mL/min (by C-G formula based on SCr of 0.59 mg/dL). Liver Function Tests: Recent Labs  Lab 09/04/20 1133  AST 16  ALT 15  ALKPHOS 80  BILITOT 1.1  PROT 7.2  ALBUMIN 4.1  No results for input(s): LIPASE, AMYLASE in the last 168 hours. No results for input(s): AMMONIA in the last 168 hours. Coagulation Profile: No results for input(s): INR, PROTIME in the last 168 hours. Cardiac Enzymes: No results for input(s): CKTOTAL, CKMB, CKMBINDEX, TROPONINI in the last 168 hours. BNP (last 3 results) No results for input(s): PROBNP in the last 8760 hours. HbA1C: No results for input(s): HGBA1C in the last 72 hours. CBG: Recent Labs  Lab 09/05/20 0154 09/05/20 0250 09/05/20 0403 09/05/20 0536 09/05/20 0647  GLUCAP 171* 152* 138* 192* 211*   Lipid Profile: No results for input(s): CHOL, HDL, LDLCALC, TRIG, CHOLHDL, LDLDIRECT in the last 72 hours. Thyroid Function Tests: No results for input(s): TSH, T4TOTAL, FREET4, T3FREE, THYROIDAB in the last 72 hours. Anemia Panel: No results for input(s): VITAMINB12, FOLATE, FERRITIN, TIBC, IRON, RETICCTPCT in the last 72 hours. Sepsis Labs: No  results for input(s): PROCALCITON, LATICACIDVEN in the last 168 hours.  Recent Results (from the past 240 hour(s))  Respiratory Panel by RT PCR (Flu A&B, Covid) - Nasopharyngeal Swab     Status: None   Collection Time: 09/04/20  5:33 PM   Specimen: Nasopharyngeal Swab  Result Value Ref Range Status   SARS Coronavirus 2 by RT PCR NEGATIVE NEGATIVE Final    Comment: (NOTE) SARS-CoV-2 target nucleic acids are NOT DETECTED.  The SARS-CoV-2 RNA is generally detectable in upper respiratoy specimens during the acute phase of infection. The lowest concentration of SARS-CoV-2 viral copies this assay can detect is 131 copies/mL. A negative result does not preclude SARS-Cov-2 infection and should not be used as the sole basis for treatment or other patient management decisions. A negative result may occur with  improper specimen collection/handling, submission of specimen other than nasopharyngeal swab, presence of viral mutation(s) within the areas targeted by this assay, and inadequate number of viral copies (<131 copies/mL). A negative result must be combined with clinical observations, patient history, and epidemiological information. The expected result is Negative.  Fact Sheet for Patients:  https://www.moore.com/  Fact Sheet for Healthcare Providers:  https://www.young.biz/  This test is no t yet approved or cleared by the [place] FDA and  has been authorized for detection and/or diagnosis of SARS-CoV-2 by FDA under an Emergency Use Authorization (EUA). This EUA will remain  in effect (meaning this test can be used) for the duration of the COVID-19 declaration under Section 564(b)(1) of the Act, 21 U.S.C. section 360bbb-3(b)(1), unless the authorization is terminated or revoked sooner.     Influenza A by PCR NEGATIVE NEGATIVE Final   Influenza B by PCR NEGATIVE NEGATIVE Final    Comment: (NOTE) The Xpert Xpress SARS-CoV-2/FLU/RSV assay  is intended as an aid in  the diagnosis of influenza from Nasopharyngeal swab specimens and  should not be used as a sole basis for treatment. Nasal washings and  aspirates are unacceptable for Xpert Xpress SARS-CoV-2/FLU/RSV  testing.  Fact Sheet for Patients: https://www.moore.com/  Fact Sheet for Healthcare Providers: https://www.young.biz/  This test is not yet approved or cleared by the [place] FDA and  has been authorized for detection and/or diagnosis of SARS-CoV-2 by  FDA under an Emergency Use Authorization (EUA). This EUA will remain  in effect (meaning this test can be used) for the duration of the  Covid-19 declaration under Section 564(b)(1) of the Act, 21  U.S.C. section 360bbb-3(b)(1), unless the authorization is  terminated or revoked. Performed at [facility], [address], [place], [place] [address]  Radiology Studies: DG Chest 2 View  Result Date: 09/04/2020 CLINICAL DATA:  Shortness of breath. Additional history provided: Increasing confusion. EXAM: CHEST - 2 VIEW COMPARISON:  Report from prior chest radiographs 11/23/2018 (images unavailable). Chest radiographs 09/19/2012. FINDINGS: Heart size within normal limits.  Aortic atherosclerosis. New from the prior examination of 09/19/2012, there is a subcentimeter nodular opacity which projects in the region of the right lung apex. There is no appreciable airspace consolidation or pulmonary edema. No evidence of pleural effusion or pneumothorax. No acute bony abnormality identified.  Thoracic spondylosis IMPRESSION: A subcentimeter nodular opacity projects in the region of the right lung apex. Nonemergent chest CT is recommended to further assess for a possible pulmonary nodule at this site. No appreciable airspace consolidation or pulmonary edema. Aortic Atherosclerosis (ICD10-I70.0). Electronically Signed   By: Jackey Loge DO   On: 09/04/2020 12:43    CT Head Wo Contrast  Result Date: 09/04/2020 CLINICAL DATA:  Mental status change, unknown cause. Additional history provided: Confusion x1 week. EXAM: CT HEAD WITHOUT CONTRAST TECHNIQUE: Contiguous axial images were obtained from the base of the skull through the vertex without intravenous contrast. COMPARISON:  No pertinent prior exams are available for comparison. FINDINGS: Brain: Mild generalized parenchymal atrophy of the brain. Lacunar infarct within the left basal ganglia which is subacute or chronic in appearance (series 2, image 10) (series 4, image 26). Background mild ill-defined hypoattenuation within the cerebral white matter is nonspecific, but consistent with chronic small vessel ischemic disease. There is no acute intracranial hemorrhage. No demarcated cortical infarct. No extra-axial fluid collection. No evidence of intracranial mass. No midline shift. Vascular: No hyperdense vessel.  Atherosclerotic calcifications. Skull: Normal. Negative for fracture or focal lesion. Sinuses/Orbits: Visualized orbits show no acute finding. No significant paranasal sinus disease or mastoid effusion at the imaged levels. IMPRESSION: There is a lacunar infarct within the left basal ganglia which is subacute or chronic in appearance. Background mild generalized atrophy of the brain and chronic small vessel ischemic disease. Electronically Signed   By: Jackey Loge DO   On: 09/04/2020 12:48        Scheduled Meds: . amLODipine  5 mg Oral Daily  . enoxaparin (LOVENOX) injection  40 mg Subcutaneous Q24H  . simvastatin  20 mg Oral Daily   Continuous Infusions: . dextrose 5% lactated ringers 125 mL/hr at 09/05/20 0325  . insulin 1.7 Units/hr (09/05/20 0649)  . lactated ringers       LOS: 0 days    Time spent: 33 mins    Charise Killian, MD [facility] Pager [phone]  If 7PM-7AM, please contact night-coverage [email or web address] 09/05/2020, 8:04 AM

## 2020-09-05 NOTE — Evaluation (Signed)
Occupational Therapy Evaluation Patient Details Name: Kristi Payne MRN: [id number] DOB: 1930-11-12 Today's Date: 09/05/2020    History of Present Illness 86 y.o. female with medical history significant for uncontrolled type 2 diabetes, hypertension hyperlipidemia who presents with concerns of altered mental status. Blood glucose of 662.   Clinical Impression   Patient presenting with decreased I in self care, balance, functional transfers/mobility, endurance, and safety awareness. Pt displaying cognitive deficits and needing increased cuing for orientation and safety awareness.Pt also displaying tangential speech with min - mod cuing to redirect to task.  Patient reports living alone and being fully independent in all aspects of care without use of AD PTA. Patient currently functioning at min A overall. Patient will benefit from acute OT to increase overall independence in the areas of ADLs, functional mobility, and safety awareness in order to safely discharge home with daughter. Pt reporting daughter can stay with her at discharge secondary to cognitive deficits and concerns over safety if home alone. If daughter is unable to provide 24/7 at discharge OT recommends SNF for short term rehab before pt is able to return home.     Follow Up Recommendations  Home health OT;Supervision/Assistance - 24 hour    Equipment Recommendations  Tub/shower bench       Precautions / Restrictions Precautions Precautions: Fall      Mobility Bed Mobility Overal bed mobility: Needs Assistance Bed Mobility: Supine to Sit;Sit to Supine     Supine to sit: Min assist Sit to supine: Min assist   General bed mobility comments: min trunk support with min cuing for hand placement and technique  Transfers Overall transfer level: Needs assistance Equipment used: 1 person hand held assist Transfers: Sit to/from [employer] Sit to Stand: Min assist Stand pivot transfers: Min guard             Balance Overall balance assessment: Needs assistance Sitting-balance support: Feet unsupported Sitting balance-Leahy Scale: Fair     Standing balance support: During functional activity Standing balance-Leahy Scale: Fair        ADL either performed or assessed with clinical judgement   ADL Overall ADL's : Needs assistance/impaired      General ADL Comments: Pt needing mod A to don B socks, set up A for grooming tasks while seated on EOB, and Min A functional transfers without use of AD.     Vision Baseline Vision/History: Wears glasses Wears Glasses: At all times Patient Visual Report: No change from baseline              Pertinent Vitals/Pain Pain Assessment: No/denies pain     Hand Dominance Right   Extremity/Trunk Assessment Upper Extremity Assessment Upper Extremity Assessment: Generalized weakness   Lower Extremity Assessment Lower Extremity Assessment: Defer to PT evaluation       Communication Communication Communication: No difficulties   Cognition Arousal/Alertness: Awake/alert Behavior During Therapy: Impulsive;Restless Overall Cognitive Status: Impaired/Different from baseline Area of Impairment: Orientation;Attention;Following commands;Safety/judgement;Awareness;Problem solving;Memory     Orientation Level: Person;Place Current Attention Level: Sustained Memory: Decreased short-term memory Following Commands: Follows one step commands consistently;Follows one step commands with increased time Safety/Judgement: Decreased awareness of safety;Decreased awareness of deficits Awareness: Intellectual Problem Solving: Slow processing;Requires verbal cues General Comments: Pt with tangential speech this session and needing mod cuing to attend to task. Pt verbalized she was in hospital but was not sure why. Very impulsive with movement and almost throwing self into stretcher rail when returning to bed.  Home Living Family/patient  expects to be discharged to:: Private residence Living Arrangements: Alone Available Help at Discharge: Family;Available 24 hours/day Type of Home: House Home Access: Stairs to enter [employer] of Steps: 2 STE Entrance Stairs-Rails: None Home Layout: One level     Bathroom Shower/Tub: Chief Strategy Officer: Standard     Home Equipment: None          Prior Functioning/Environment Level of Independence: Independent                 OT Problem List: Decreased strength;Decreased coordination;Decreased cognition;Decreased safety awareness;Decreased activity tolerance;Impaired balance (sitting and/or standing);Decreased knowledge of use of DME or AE;Decreased knowledge of precautions      OT Treatment/Interventions: Self-care/ADL training;Therapeutic exercise;Therapeutic activities;Energy conservation;Patient/family education;DME and/or AE instruction;Balance training    OT Goals(Current goals can be found in the care plan section) Acute Rehab OT Goals Patient Stated Goal: to go home OT Goal Formulation: With patient Time For Goal Achievement: 09/19/20 Potential to Achieve Goals: Good ADL Goals Pt Will Perform Grooming: with modified independence;standing;with adaptive equipment Pt Will Transfer to Toilet: with modified independence;ambulating Pt Will Perform Toileting - Clothing Manipulation and hygiene: with modified independence;sit to/from stand  OT Frequency: Min 1X/week   Barriers to D/C: Decreased caregiver support  Pt is likely poor historian and reports her daughter can stay with her 24/7 at discharge but if pt does not have family support she will likely need SNF          AM-PAC OT "6 Clicks" Daily Activity     Outcome Measure Help from another person eating meals?: None Help from another person taking care of personal grooming?: A Little Help from another person toileting, which includes using toliet, bedpan, or urinal?: A Lot Help  from another person bathing (including washing, rinsing, drying)?: A Little Help from another person to put on and taking off regular upper body clothing?: A Little Help from another person to put on and taking off regular lower body clothing?: A Lot 6 Click Score: 17   End of Session Nurse Communication: Mobility status;Precautions  Activity Tolerance: Patient tolerated treatment well Patient left: in bed;with call bell/phone within reach;Other (comment) (breakfast tray placed in front of him)  OT Visit Diagnosis: Unsteadiness on feet (R26.81);Muscle weakness (generalized) (M62.81)                Time: 0737-1062 OT Time Calculation (min): 20 min Charges:  OT General Charges $OT Visit: 1 Visit OT Evaluation $OT Eval Low Complexity: 1 Low OT Treatments $Self Care/Home Management : 8-22 mins  Jackquline Denmark, MS, OTR/L , CBIS ascom [phone]  09/05/20, 10:57 AM

## 2020-09-06 LAB — HEMOGLOBIN A1C
Hgb A1c MFr Bld: >15.5 % — ABNORMAL HIGH (ref 4.8–5.6)
Mean Plasma Glucose: >398 mg/dL

## 2020-09-06 LAB — CBC
HCT: 33.3 % — ABNORMAL LOW (ref 36.0–46.0)
Hemoglobin: 11.2 g/dL — ABNORMAL LOW (ref 12.0–15.0)
MCH: 29.7 pg (ref 26.0–34.0)
MCHC: 33.6 g/dL (ref 30.0–36.0)
MCV: 88.3 fL (ref 80.0–100.0)
Platelets: 193 10*3/uL (ref 150–400)
RBC: 3.77 MIL/uL — ABNORMAL LOW (ref 3.87–5.11)
RDW: 12 % (ref 11.5–15.5)
WBC: 4.9 10*3/uL (ref 4.0–10.5)
nRBC: 0 % (ref 0.0–0.2)

## 2020-09-06 LAB — BASIC METABOLIC PANEL
Anion gap: 10 (ref 5–15)
BUN: 16 mg/dL (ref 8–23)
CO2: 25 mmol/L (ref 22–32)
Calcium: 8.5 mg/dL — ABNORMAL LOW (ref 8.9–10.3)
Chloride: 98 mmol/L (ref 98–111)
Creatinine, Ser: 0.7 mg/dL (ref 0.44–1.00)
GFR, Estimated: >60 mL/min (ref >60)
Glucose, Bld: 199 mg/dL — ABNORMAL HIGH (ref 70–99)
Potassium: 4 mmol/L (ref 3.5–5.1)
Sodium: 133 mmol/L — ABNORMAL LOW (ref 135–145)

## 2020-09-06 LAB — GLUCOSE, CAPILLARY
Glucose-Capillary: 230 mg/dL — ABNORMAL HIGH (ref 70–99)
Glucose-Capillary: 353 mg/dL — ABNORMAL HIGH (ref 70–99)
Glucose-Capillary: 224 mg/dL — ABNORMAL HIGH (ref 70–99)
Glucose-Capillary: 281 mg/dL — ABNORMAL HIGH (ref 70–99)

## 2020-09-06 MED ORDER — INSULIN GLARGINE 100 UNIT/ML ~~LOC~~ SOLN
10.0000 [IU] | Freq: Every day | SUBCUTANEOUS | Status: DC
  Administered 2020-09-06: 10 [IU] via SUBCUTANEOUS
  Filled 2020-09-06 (×2): qty 0.1

## 2020-09-06 MED ORDER — ACETAMINOPHEN 325 MG PO TABS
650.0000 mg | ORAL_TABLET | Freq: Four times a day (QID) | ORAL | Status: DC | PRN
  Administered 2020-09-06 – 2020-09-07 (×2): 650 mg via ORAL
  Filled 2020-09-06 (×2): qty 2

## 2020-09-06 NOTE — Progress Notes (Signed)
PROGRESS NOTE  Kristi Payne  POE:[id number] DOB: [birth year] DOA: 09/04/2020 PCP: Jerl Mina, MD   Brief Narrative: Kristi Payne is an 86 y.o. female with a history of uncontrolled T2DM, HTN, HLD who brought to the ED by her pastor 09/04/2020 with AMS with several days of dizziness. She was found to be in mild DKA with glucose 662, anion gap 16, pH 7.29 and with AKI (Cr 1.10). IV insulin was started and the patient was admitted, ultimately transitioning to basal-bolus dosing. Due to safety concerns, 24 hour supervision or SNF placement is recommended.   Assessment & Plan: Active Problems:   Hyperglycemia due to diabetes mellitus (HCC)   DM2 (diabetes mellitus, type 2) (HCC)  Mild DKA in poorly-controlled T2DM:  - HbA1c pending. Will plan to start long-acting insulin at discharge and follow up with PCP for ability to administer and consideration of bolus-dosing addition.  - Increase lantus to 10u, instruct pt on administration. D/w RN.  - DM coordinator consulted - Carb-modified diet  AKI: Resolved.  - Avoid nephrotoxins  HTN:  - Continue norvasc, lisinopril. Holding HCTZ w/AKI as above.   HLD:  - Continue statin  Acute metabolic encephalopathy: Likely related to metabolic derangements, has improved.   Subacute or chronic left basal ganglia lacunar infarct as per CT scan.  - Risk factor modification, specifically improved DM and HTN control recommended.  DVT prophylaxis: Lovenox Code Status: Full Family Communication: Daughter this afternoon Disposition Plan:  Status is: Inpatient  Remains inpatient appropriate because:Inpatient level of care appropriate due to severity of illness  Dispo: The patient is from: Home              Anticipated d/c is to: [facility] if daughter can remain with her 24/7, SNF if not. Pt reluctant to go to SNF. Daughter from [place] arriving today.              Anticipated d/c date is: 1 day              Patient currently is not medically stable to  d/c.  Consultants:   DM coordinator  Procedures:   None  Antimicrobials:  None   Subjective: Feels well, a little wobbly on her feet but that improves with practice and after being awake a while. Amenable to taking insulin if needed. Does not want to go to SNF.   Objective: Vitals:   09/05/20 2044 09/06/20 0009 09/06/20 0355 09/06/20 0849  BP: (!) 105/52 (!) 117/58 (!) 127/58 110/87  Pulse: 91 84 74 88  Resp: 17 19 16    Temp: 98.4 F (36.9 C) 98.5 F (36.9 C) 98.5 F (36.9 C) 98.7 F (37.1 C)  TempSrc: Oral Oral Oral Oral  SpO2: 96% 96% 98% 97%  Weight:      Height:        Intake/Output Summary (Last 24 hours) at 09/06/2020 1455 Last data filed at 09/06/2020 1300 Gross per 24 hour  Intake 480 ml  Output 0 ml  Net 480 ml   Filed Weights   09/04/20 1122  Weight: 47.6 kg   Gen: Elderly female in no distress Pulm: Non-labored breathing. Clear to auscultation bilaterally.  CV: Regular rate and rhythm. No murmur, rub, or gallop. No JVD, no pedal edema. GI: Abdomen soft, non-tender, non-distended, with normoactive bowel sounds. No organomegaly or masses felt. Ext: Warm, no deformities Skin: No rashes, lesions or ulcers Neuro: Alert and essentially oriented. No focal neurological deficits. Psych: Judgement and insight appear normal. Mood & affect appropriate.  Data Reviewed: I have personally reviewed following labs and imaging studies  CBC: Recent Labs  Lab 09/04/20 1133 09/06/20 0450  WBC 5.8 4.9  HGB 11.7* 11.2*  HCT 34.5* 33.3*  MCV 87.6 88.3  PLT 241 193   Basic Metabolic Panel: Recent Labs  Lab 09/04/20 1133 09/04/20 1145 09/04/20 2042 09/05/20 0408 09/06/20 0450  NA 124*  --  135 133* 133*  K 5.4*  --  3.5 3.8 4.0  CL 87*  --  97* 98 98  CO2 21*  --  22 27 25   GLUCOSE 662*  --  102* 149* 199*  BUN 31*  --  20 16 16   CREATININE 1.10*  --  0.62 0.59 0.70  CALCIUM 9.6  --  8.8* 8.6* 8.5*  MG  --  2.2  --   --   --    GFR: Estimated  Creatinine Clearance: 34.9 mL/min (by C-G formula based on SCr of 0.7 mg/dL). Liver Function Tests: Recent Labs  Lab 09/04/20 1133  AST 16  ALT 15  ALKPHOS 80  BILITOT 1.1  PROT 7.2  ALBUMIN 4.1   No results for input(s): LIPASE, AMYLASE in the last 168 hours. No results for input(s): AMMONIA in the last 168 hours. Coagulation Profile: No results for input(s): INR, PROTIME in the last 168 hours. Cardiac Enzymes: No results for input(s): CKTOTAL, CKMB, CKMBINDEX, TROPONINI in the last 168 hours. BNP (last 3 results) No results for input(s): PROBNP in the last 8760 hours. HbA1C: Recent Labs    09/04/20 2042  HGBA1C >15.5*   CBG: Recent Labs  Lab 09/05/20 1246 09/05/20 1722 09/05/20 2139 09/06/20 0806 09/06/20 1156  GLUCAP 373* 331* 186* 230* 353*   Lipid Profile: No results for input(s): CHOL, HDL, LDLCALC, TRIG, CHOLHDL, LDLDIRECT in the last 72 hours. Thyroid Function Tests: No results for input(s): TSH, T4TOTAL, FREET4, T3FREE, THYROIDAB in the last 72 hours. Anemia Panel: No results for input(s): VITAMINB12, FOLATE, FERRITIN, TIBC, IRON, RETICCTPCT in the last 72 hours. Urine analysis:    Component Value Date/Time   COLORURINE STRAW (A) 09/04/2020 1733   APPEARANCEUR CLEAR (A) 09/04/2020 1733   LABSPEC 1.022 09/04/2020 1733   PHURINE 5.0 09/04/2020 1733   GLUCOSEU >=500 (A) 09/04/2020 1733   HGBUR NEGATIVE 09/04/2020 1733   BILIRUBINUR NEGATIVE 09/04/2020 1733   KETONESUR 80 (A) 09/04/2020 1733   PROTEINUR NEGATIVE 09/04/2020 1733   NITRITE NEGATIVE 09/04/2020 1733   LEUKOCYTESUR NEGATIVE 09/04/2020 1733   Recent Results (from the past 240 hour(s))  Respiratory Panel by RT PCR (Flu A&B, Covid) - Nasopharyngeal Swab     Status: None   Collection Time: 09/04/20  5:33 PM   Specimen: Nasopharyngeal Swab  Result Value Ref Range Status   SARS Coronavirus 2 by RT PCR NEGATIVE NEGATIVE Final    Comment: (NOTE) SARS-CoV-2 target nucleic acids are NOT  DETECTED.  The SARS-CoV-2 RNA is generally detectable in upper respiratoy specimens during the acute phase of infection. The lowest concentration of SARS-CoV-2 viral copies this assay can detect is 131 copies/mL. A negative result does not preclude SARS-Cov-2 infection and should not be used as the sole basis for treatment or other patient management decisions. A negative result may occur with  improper specimen collection/handling, submission of specimen other than nasopharyngeal swab, presence of viral mutation(s) within the areas targeted by this assay, and inadequate number of viral copies (<131 copies/mL). A negative result must be combined with clinical observations, patient history, and epidemiological information. The expected  result is Negative.  Fact Sheet for Patients:  https://www.moore.com/  Fact Sheet for Healthcare Providers:  https://www.young.biz/  This test is no t yet approved or cleared by the [place] FDA and  has been authorized for detection and/or diagnosis of SARS-CoV-2 by FDA under an Emergency Use Authorization (EUA). This EUA will remain  in effect (meaning this test can be used) for the duration of the COVID-19 declaration under Section 564(b)(1) of the Act, 21 U.S.C. section 360bbb-3(b)(1), unless the authorization is terminated or revoked sooner.     Influenza A by PCR NEGATIVE NEGATIVE Final   Influenza B by PCR NEGATIVE NEGATIVE Final    Comment: (NOTE) The Xpert Xpress SARS-CoV-2/FLU/RSV assay is intended as an aid in  the diagnosis of influenza from Nasopharyngeal swab specimens and  should not be used as a sole basis for treatment. Nasal washings and  aspirates are unacceptable for Xpert Xpress SARS-CoV-2/FLU/RSV  testing.  Fact Sheet for Patients: https://www.moore.com/  Fact Sheet for Healthcare Providers: https://www.young.biz/  This test is not yet  approved or cleared by the [place] FDA and  has been authorized for detection and/or diagnosis of SARS-CoV-2 by  FDA under an Emergency Use Authorization (EUA). This EUA will remain  in effect (meaning this test can be used) for the duration of the  Covid-19 declaration under Section 564(b)(1) of the Act, 21  U.S.C. section 360bbb-3(b)(1), unless the authorization is  terminated or revoked. Performed at [facility], [address] [place], [place] [address]       Radiology Studies: No results found.  Scheduled Meds: . amLODipine  5 mg Oral Daily  . enoxaparin (LOVENOX) injection  40 mg Subcutaneous Q24H  . insulin aspart  0-9 Units Subcutaneous TID WC  . insulin glargine  10 Units Subcutaneous Daily  . lisinopril  40 mg Oral Daily  . simvastatin  20 mg Oral Daily   Continuous Infusions:   LOS: 1 day   Time spent: 25 minutes.  Tyrone Nine, MD [facility] [email or web address] 09/06/2020, 2:55 PM

## 2020-09-06 NOTE — TOC Initial Note (Signed)
Transition of Care [facility]) - Initial/Assessment Note    Patient Details  Name: Kristi Payne MRN: [id number] Date of Birth: 07-Aug-1930  Transition of [facility]) CM/SW Contact:    Shelbie Ammons, RN Phone Number: 09/06/2020, 3:50 PM  Clinical Narrative:     RNCM met with patient, her daughter and pastor in room. Patient sitting up in recliner and reports to feeling better today. Both patient and visitors have numerous questions regarding all options regarding placement and help at home when patient discharges. All questions were answered as appropriate. Patient lives at home alone, her daughter is visiting from [place]. Patient has friends and neighbors that are able to check on her but no one that is able to stay with her 24 hours a day. After much discussion patient is agreeable to skilled facility bed search.  RNCM completed PASSR and FL2 and started bed search.  RNCM will meet with patient and family at around 11am on Thursday in room per their request.             Expected Discharge Plan: Skilled Nursing Facility Barriers to Discharge: No Barriers Identified   Patient Goals and CMS Choice Patient states their goals for this hospitalization and ongoing recovery are:: Per patient to be able to go home when she is able   Choice offered to / list presented to : Patient, Adult Children  Expected Discharge Plan and Services Expected Discharge Plan: [place] Choice: [place] arrangements for the past 2 months: Single Family Home                                      Prior Living Arrangements/Services Living arrangements for the past 2 months: Single Family Home Lives with:: Self Patient language and need for interpreter reviewed:: Yes Do you feel safe going back to the place where you live?: Yes      Need for Family Participation in Patient Care: Yes (Comment) Care giver support system in place?: Yes (comment)    Criminal Activity/Legal Involvement Pertinent to Current Situation/Hospitalization: No - Comment as needed  Activities of Daily Living Home Assistive Devices/Equipment: None ADL Screening (condition at time of admission) Patient's cognitive ability adequate to safely complete daily activities?: No Is the patient deaf or have difficulty hearing?: No Does the patient have difficulty seeing, even when wearing glasses/contacts?: No Does the patient have difficulty concentrating, remembering, or making decisions?: No Patient able to express need for assistance with ADLs?: No Does the patient have difficulty dressing or bathing?: No Independently performs ADLs?: Yes (appropriate for developmental age) Does the patient have difficulty walking or climbing stairs?: Yes Weakness of Legs: Both Weakness of Arms/Hands: None  Permission Sought/Granted                  Emotional Assessment Appearance:: Appears stated age Attitude/Demeanor/Rapport: Engaged Affect (typically observed): Appropriate Orientation: : Oriented to Self, Oriented to Place, Oriented to  Time, Oriented to Situation Alcohol / Substance Use: Not Applicable Psych Involvement: No (comment)  Admission diagnosis:  Confusion [R41.0] DM2 (diabetes mellitus, type 2) ([place]) [E11.9] Diabetic ketoacidosis without coma associated with other specified diabetes mellitus ([place]) [E13.10] Hyperglycemia due to diabetes mellitus ([place]) [E11.65] Patient Active Problem List   Diagnosis Date Noted  . DM2 (diabetes mellitus, type 2) ([place]) 09/05/2020  . Hyperglycemia due to diabetes mellitus ([place]) 09/04/2020  PCP:  [place] Pink, MD Pharmacy:   [facility] [address] [place] - [place] [address] [place] [address] Phone: [phone] Fax: [phone]     Social Determinants of Health (SDOH) Interventions    Readmission Risk Interventions No flowsheet data found.

## 2020-09-06 NOTE — Progress Notes (Signed)
Physical Therapy Treatment Patient Details Name: Kristi Payne MRN: [id number] DOB: [birth year] Today's Date: 09/06/2020    History of Present Illness 86 y.o. female with medical history significant for uncontrolled type 2 diabetes, hypertension hyperlipidemia who presents with concerns of altered mental status. Blood glucose of 662 on arrival.  CT notes subacute CVA.    PT Comments    Pt presents sitting in recliner chair on arrival to room and is agreeable to PT session. Demonstrated how to use RW before patient stood up. However, she did not use RW during sit<>stand transfer. She is independent with sit<>stand transfer and performs safely with no demonstration of impulsiveness or general lack of awareness. She understood to wait until PT is ready before walking. She walked 217 feet with RW and CGA, however needed constant VCs for safety with walker management. She demonstrated step to pattern with occasional staggering of feet. Once in her room, she walked 3 feet without any AD. While she did not demonstrate any overt LOB, she felt and looked more steady using the RW. Once seated in chair, patient performed therapeutic exercises and instructed to perform the 3 exercises written on the white board throughout her day. If patient continues to improve with safety awareness and cognition, will consider upgrading discharge recommendations. Pt will benefit from skilled PT services to address deficits in strength, balance, and decrease risk for future falls.    Follow Up Recommendations  SNF;Supervision/Assistance - 24 hour     Equipment Recommendations  Rolling walker with 5" wheels    Recommendations for Other Services       Precautions / Restrictions Precautions Precautions: Fall Restrictions Weight Bearing Restrictions: No    Mobility  Bed Mobility               General bed mobility comments: Patient sitting in chair on arrival to room so did not assess bed mobility this  session  Transfers Overall transfer level: Independent Equipment used: None   Sit to Stand: Independent         General transfer comment: Demonstrated how to use RW before patient stood up. However, did not use RW during sit<>stand transfer. Pt was able to stand independently without increased time or phyiscal assist and pt understood to wait until PT is ready before walking  Ambulation/Gait Ambulation/Gait assistance: Min guard Gait Distance (Feet): 220 Feet Assistive device: Rolling walker (2 wheeled) Gait Pattern/deviations: Step-to pattern;Decreased step length - right;Decreased step length - left     General Gait Details: Pt needed constant VCs for walker management but demonstrates step through pattern with occasional staggering of feet. She utilized RW for 217 feet and the other 3 feet was without any AD. She felt and looked more steady when walking with RW.   Stairs             Wheelchair Mobility    Modified Rankin (Stroke Patients Only)       Balance Overall balance assessment: Needs assistance Sitting-balance support: Feet supported Sitting balance-Leahy Scale: Good Sitting balance - Comments: Patient able to maintain good sitting balance in recliner chair   Standing balance support: During functional activity Standing balance-Leahy Scale: Good Standing balance comment: Patient able to maintain good standing balance and shows no LOB.                            Cognition Arousal/Alertness: Awake/alert Behavior During Therapy: WFL for tasks assessed/performed Overall Cognitive Status: No family/caregiver present to  determine baseline cognitive functioning Area of Impairment: Following commands;Safety/judgement;Awareness                               General Comments: Pt understood instructions to stay within walker, however frequent VCs utilized for proper technique with RW      Exercises General Exercises - Lower  Extremity Ankle Circles/Pumps: AROM;Both;10 reps;Seated Gluteal Sets: AROM;Both;10 reps;Seated Long Arc Quad: AROM;Both;10 reps;Seated Straight Leg Raises: AROM;Both;10 reps;Seated Hip Flexion/Marching: AROM;Both;10 reps;Seated    General Comments        Pertinent Vitals/Pain Pain Assessment: No/denies pain    Home Living                      Prior Function            PT Goals (current goals can now be found in the care plan section) Acute Rehab PT Goals Patient Stated Goal: to go home PT Goal Formulation: With patient Time For Goal Achievement: 09/19/20 Potential to Achieve Goals: Fair Progress towards PT goals: Progressing toward goals    Frequency    Min 2X/week      PT Plan Current plan remains appropriate    Co-evaluation              AM-PAC PT "6 Clicks" Mobility   Outcome Measure  Help needed turning from your back to your side while in a flat bed without using bedrails?: A Little Help needed moving from lying on your back to sitting on the side of a flat bed without using bedrails?: A Little Help needed moving to and from a bed to a chair (including a wheelchair)?: A Little Help needed standing up from a chair using your arms (e.g., wheelchair or bedside chair)?: A Little Help needed to walk in hospital room?: A Little Help needed climbing 3-5 steps with a railing? : A Lot 6 Click Score: 17    End of Session Equipment Utilized During Treatment: Gait belt Activity Tolerance: Patient tolerated treatment well Patient left: in chair;with call bell/phone within reach;with chair alarm set Nurse Communication: Mobility status PT Visit Diagnosis: Muscle weakness (generalized) (M62.81);Difficulty in walking, not elsewhere classified (R26.2)     Time: 0174-9449 PT Time Calculation (min) (ACUTE ONLY): 24 min  Charges:                         Katherine Basset, SPT Baker Pierini 09/06/2020, 12:44 PM

## 2020-09-06 NOTE — [place] FL2 (Signed)
  [place] MEDICAID FL2 LEVEL OF CARE SCREENING TOOL     IDENTIFICATION  Patient Name: Kristi Payne Birthdate: 09-Jul-1930 Sex: female Admission Date (Current Location): 09/04/2020  [place] and [place] Number:  Chiropodist and Address:  [facility], [address] [place], [place] [address]      Provider Number: [id number]  Attending Physician Name and Address:  Tyrone Nine, MD  Relative Name and Phone Number:  Dene Gentry [phone]    Current Level of Care: Hospital Recommended Level of Care: Skilled Nursing Facility Prior Approval Number:    Date Approved/Denied:   PASRR Number: [id number]  Discharge Plan: SNF    Current Diagnoses: Patient Active Problem List   Diagnosis Date Noted  . DM2 (diabetes mellitus, type 2) (HCC) 09/05/2020  . Hyperglycemia due to diabetes mellitus (HCC) 09/04/2020    Orientation RESPIRATION BLADDER Height & Weight     Self, Time, Situation, Place  Normal Continent Weight: 47.6 kg Height:  5' (152.4 cm)  BEHAVIORAL SYMPTOMS/MOOD NEUROLOGICAL BOWEL NUTRITION STATUS      Continent Diet (Carb Modified)  AMBULATORY STATUS COMMUNICATION OF NEEDS Skin   Limited Assist Verbally Normal                       Personal Care Assistance Level of Assistance  Bathing, Feeding, Dressing Bathing Assistance: Limited assistance Feeding assistance: Independent Dressing Assistance: Limited assistance     Functional Limitations Info  Sight, Hearing, Speech          SPECIAL CARE FACTORS FREQUENCY  PT (By licensed PT), OT (By licensed OT)                    Contractures Contractures Info: Not present    Additional Factors Info  Code Status, Allergies Code Status Info: Full Allergies Info: Penicillins           Current Medications (09/06/2020):  This is the current hospital active medication list Current Facility-Administered Medications  Medication Dose Route Frequency Provider  Last Rate Last Admin  . amLODipine (NORVASC) tablet 5 mg  5 mg Oral Daily Tu, Ching T, DO   5 mg at 09/06/20 0844  . dextrose 50 % solution 0-50 mL  0-50 mL Intravenous PRN Phineas Semen, MD      . enoxaparin (LOVENOX) injection 40 mg  40 mg Subcutaneous Q24H Tu, Ching T, DO   40 mg at 09/05/20 2136  . insulin aspart (novoLOG) injection 0-9 Units  0-9 Units Subcutaneous TID WC Charise Killian, MD   9 Units at 09/06/20 1219  . insulin glargine (LANTUS) injection 10 Units  10 Units Subcutaneous Daily Tyrone Nine, MD   10 Units at 09/06/20 1220  . lisinopril (ZESTRIL) tablet 40 mg  40 mg Oral Daily Charise Killian, MD   40 mg at 09/06/20 0844  . simvastatin (ZOCOR) tablet 20 mg  20 mg Oral Daily Tu, Ching T, DO   20 mg at 09/06/20 8469     Discharge Medications: Please see discharge summary for a list of discharge medications.  Relevant Imaging Results:  Relevant Lab Results:   Additional Information SS# [id number]  Trenton Founds, RN

## 2020-09-07 LAB — GLUCOSE, CAPILLARY
Glucose-Capillary: 286 mg/dL — ABNORMAL HIGH (ref 70–99)
Glucose-Capillary: 397 mg/dL — ABNORMAL HIGH (ref 70–99)
Glucose-Capillary: 270 mg/dL — ABNORMAL HIGH (ref 70–99)
Glucose-Capillary: 248 mg/dL — ABNORMAL HIGH (ref 70–99)

## 2020-09-07 LAB — SARS CORONAVIRUS 2 BY RT PCR (HOSPITAL ORDER, PERFORMED IN ~~LOC~~ HOSPITAL LAB): SARS Coronavirus 2: NEGATIVE

## 2020-09-07 MED ORDER — INSULIN ASPART 100 UNIT/ML ~~LOC~~ SOLN: 0.0000 [IU] | Freq: Three times a day (TID) | SUBCUTANEOUS | Status: AC

## 2020-09-07 MED ORDER — INSULIN GLARGINE 100 UNIT/ML ~~LOC~~ SOLN: 12.0000 [IU] | Freq: Every day | SUBCUTANEOUS | Status: AC

## 2020-09-07 MED ORDER — INSULIN ASPART 100 UNIT/ML ~~LOC~~ SOLN
0.0000 [IU] | SUBCUTANEOUS | Status: DC
  Administered 2020-09-08: 9 [IU] via SUBCUTANEOUS
  Administered 2020-09-08: 2 [IU] via SUBCUTANEOUS
  Administered 2020-09-08: 3 [IU] via SUBCUTANEOUS
  Administered 2020-09-08: 1 [IU] via SUBCUTANEOUS
  Filled 2020-09-07 (×4): qty 1

## 2020-09-07 MED ORDER — ASPIRIN EC 81 MG PO TBEC: 81.0000 mg | DELAYED_RELEASE_TABLET | Freq: Every day | ORAL | Status: AC

## 2020-09-07 MED ORDER — INSULIN GLARGINE 100 UNIT/ML ~~LOC~~ SOLN
12.0000 [IU] | Freq: Every day | SUBCUTANEOUS | Status: DC
  Administered 2020-09-07 – 2020-09-08 (×2): 12 [IU] via SUBCUTANEOUS
  Filled 2020-09-07 (×3): qty 0.12

## 2020-09-07 NOTE — Discharge Summary (Signed)
Physician Discharge Summary  Kristi Payne ZOX:[id number] DOB: Jun 20, 1930 DOA: 09/04/2020  PCP: Jerl Mina, MD  Admit date: 09/04/2020 Discharge date: 09/07/2020  Admitted From: Home Disposition: SNF [facility]   Recommendations for Outpatient Follow-up:  1. Follow up with PCP in 1-2 weeks 2. Monitor CBGs with lantus + novolog coverage (new start insulin), titrate dosing as indicated.   Home Health: N/A Equipment/Devices: Per SNF Discharge Condition: Stable CODE STATUS: Full Diet recommendation: Carb-modified  Brief/Interim Summary: Kristi Payne is an 86 y.o. female with a history of uncontrolled T2DM, HTN, HLD who brought to the ED by her pastor 09/04/2020 with AMS with several days of dizziness. She was found to be in mild DKA with glucose 662, anion gap 16, pH 7.29 and with AKI (Cr 1.10). IV insulin was started and the patient was admitted, ultimately transitioning to basal-bolus dosing. Hemoglobin A1c was 15.5%. Due to safety concerns and need for rehabilitation, SNF placement is pursued.   Discharge Diagnoses:  Active Problems:   Hyperglycemia due to diabetes mellitus (HCC)   DM2 (diabetes mellitus, type 2) (HCC)  Mild DKA in poorly-controlled T2DM: HbA1c 15.5%. We need to start long-acting insulin at discharge and follow up with PCP for ability to administer and consideration of bolus-dosing addition.  - Recommend close CBG monitoring and education. Lantus 12u + sensitive SSI TIDWC and at HS coverage recommended with continued titration. - With CrCl <24ml/min, will stop metformin. - Carb-modified diet  AKI on stage IIIb CKD (based on available lab results): Resolved.  - Avoid nephrotoxins  HTN:  - Continue home medications  HLD:  - Continue statin  Acute metabolic encephalopathy: Likely related to metabolic derangements, has improved.   Subacute or chronic left basal ganglia lacunar infarct as per CT scan.  - Risk factor modification, specifically improved  DM and HTN control recommended. - Start aspirin 81mg , continue statin. - Needs outpatient neurology follow up.  Discharge Instructions  Allergies as of 09/07/2020      Reactions   Penicillins    Other reaction(s): Unknown      Medication List    STOP taking these medications   metFORMIN 1000 MG tablet Commonly known as: GLUCOPHAGE     TAKE these medications   amLODipine 5 MG tablet Commonly known as: NORVASC Take 5 mg by mouth daily.   aspirin EC 81 MG tablet Take 1 tablet (81 mg total) by mouth daily.   hydrochlorothiazide 25 MG tablet Commonly known as: HYDRODIURIL Take 25 mg by mouth daily.   insulin aspart 100 UNIT/ML injection Commonly known as: novoLOG Inject 0-9 Units into the skin 3 (three) times daily with meals.   insulin glargine 100 UNIT/ML injection Commonly known as: LANTUS Inject 0.12 mLs (12 Units total) into the skin daily. Start taking on: September 08, 2020   lisinopril 40 MG tablet Commonly known as: ZESTRIL Take 40 mg by mouth daily.   simvastatin 20 MG tablet Commonly known as: ZOCOR Take 20 mg by mouth daily.       Contact information for follow-up providers    September 10, 2020, MD. Schedule an appointment as soon as possible for a visit.   Specialty: Family Medicine Contact information: [address] [facility] [place] [phone]            Contact information for after-discharge care    Destination    [facility] .   Service: Skilled [phone] information: [address] [place] [phone]  Allergies  Allergen Reactions  . Penicillins     Other reaction(s): Unknown    Consultations:  None  Procedures/Studies: DG Chest 2 View  Result Date: 09/04/2020 CLINICAL DATA:  Shortness of breath. Additional history provided: Increasing confusion. EXAM: CHEST - 2 VIEW COMPARISON:  Report from prior chest radiographs  11/23/2018 (images unavailable). Chest radiographs 09/19/2012. FINDINGS: Heart size within normal limits.  Aortic atherosclerosis. New from the prior examination of 09/19/2012, there is a subcentimeter nodular opacity which projects in the region of the right lung apex. There is no appreciable airspace consolidation or pulmonary edema. No evidence of pleural effusion or pneumothorax. No acute bony abnormality identified.  Thoracic spondylosis IMPRESSION: A subcentimeter nodular opacity projects in the region of the right lung apex. Nonemergent chest CT is recommended to further assess for a possible pulmonary nodule at this site. No appreciable airspace consolidation or pulmonary edema. Aortic Atherosclerosis (ICD10-I70.0). Electronically Signed   By: Jackey Loge DO   On: 09/04/2020 12:43   CT Head Wo Contrast  Result Date: 09/04/2020 CLINICAL DATA:  Mental status change, unknown cause. Additional history provided: Confusion x1 week. EXAM: CT HEAD WITHOUT CONTRAST TECHNIQUE: Contiguous axial images were obtained from the base of the skull through the vertex without intravenous contrast. COMPARISON:  No pertinent prior exams are available for comparison. FINDINGS: Brain: Mild generalized parenchymal atrophy of the brain. Lacunar infarct within the left basal ganglia which is subacute or chronic in appearance (series 2, image 10) (series 4, image 26). Background mild ill-defined hypoattenuation within the cerebral white matter is nonspecific, but consistent with chronic small vessel ischemic disease. There is no acute intracranial hemorrhage. No demarcated cortical infarct. No extra-axial fluid collection. No evidence of intracranial mass. No midline shift. Vascular: No hyperdense vessel.  Atherosclerotic calcifications. Skull: Normal. Negative for fracture or focal lesion. Sinuses/Orbits: Visualized orbits show no acute finding. No significant paranasal sinus disease or mastoid effusion at the imaged levels.  IMPRESSION: There is a lacunar infarct within the left basal ganglia which is subacute or chronic in appearance. Background mild generalized atrophy of the brain and chronic small vessel ischemic disease. Electronically Signed   By: Jackey Loge DO   On: 09/04/2020 12:48     Subjective: Feels well, slept ok. Eating well. Not as weak as at admission.   Discharge Exam: Vitals:   09/06/20 2342 09/07/20 0814  BP: (!) 128/56 (!) 141/62  Pulse: 80 90  Resp: 17 17  Temp: 99.4 F (37.4 C) 99.2 F (37.3 C)  SpO2: 97% 95%   General: Pt is alert, awake, not in acute distress Cardiovascular: RRR, S1/S2 +, no rubs, no gallops Respiratory: CTA bilaterally, no wheezing, no rhonchi Abdominal: Soft, NT, ND, bowel sounds + Extremities: No edema, no cyanosis  Labs: BNP (last 3 results) No results for input(s): BNP in the last 8760 hours. Basic Metabolic Panel: Recent Labs  Lab 09/04/20 1133 09/04/20 1145 09/04/20 2042 09/05/20 0408 09/06/20 0450  NA 124*  --  135 133* 133*  K 5.4*  --  3.5 3.8 4.0  CL 87*  --  97* 98 98  CO2 21*  --  22 27 25   GLUCOSE 662*  --  102* 149* 199*  BUN 31*  --  20 16 16   CREATININE 1.10*  --  0.62 0.59 0.70  CALCIUM 9.6  --  8.8* 8.6* 8.5*  MG  --  2.2  --   --   --    Liver Function Tests: Recent Labs  Lab 09/04/20 1133  AST 16  ALT 15  ALKPHOS 80  BILITOT 1.1  PROT 7.2  ALBUMIN 4.1   No results for input(s): LIPASE, AMYLASE in the last 168 hours. No results for input(s): AMMONIA in the last 168 hours. CBC: Recent Labs  Lab 09/04/20 1133 09/06/20 0450  WBC 5.8 4.9  HGB 11.7* 11.2*  HCT 34.5* 33.3*  MCV 87.6 88.3  PLT 241 193   Cardiac Enzymes: No results for input(s): CKTOTAL, CKMB, CKMBINDEX, TROPONINI in the last 168 hours. BNP: Invalid input(s): POCBNP CBG: Recent Labs  Lab 09/06/20 1156 09/06/20 1708 09/06/20 2122 09/07/20 0813 09/07/20 1149  GLUCAP 353* 224* 281* 286* 397*   D-Dimer No results for input(s): DDIMER in  the last 72 hours. Hgb A1c Recent Labs    09/04/20 2042  HGBA1C >15.5*   Lipid Profile No results for input(s): CHOL, HDL, LDLCALC, TRIG, CHOLHDL, LDLDIRECT in the last 72 hours. Thyroid function studies No results for input(s): TSH, T4TOTAL, T3FREE, THYROIDAB in the last 72 hours.  Invalid input(s): FREET3 Anemia work up No results for input(s): VITAMINB12, FOLATE, FERRITIN, TIBC, IRON, RETICCTPCT in the last 72 hours. Urinalysis    Component Value Date/Time   COLORURINE STRAW (A) 09/04/2020 1733   APPEARANCEUR CLEAR (A) 09/04/2020 1733   LABSPEC 1.022 09/04/2020 1733   PHURINE 5.0 09/04/2020 1733   GLUCOSEU >=500 (A) 09/04/2020 1733   HGBUR NEGATIVE 09/04/2020 1733   BILIRUBINUR NEGATIVE 09/04/2020 1733   KETONESUR 80 (A) 09/04/2020 1733   PROTEINUR NEGATIVE 09/04/2020 1733   NITRITE NEGATIVE 09/04/2020 1733   LEUKOCYTESUR NEGATIVE 09/04/2020 1733    Microbiology Recent Results (from the past 240 hour(s))  Respiratory Panel by RT PCR (Flu A&B, Covid) - Nasopharyngeal Swab     Status: None   Collection Time: 09/04/20  5:33 PM   Specimen: Nasopharyngeal Swab  Result Value Ref Range Status   SARS Coronavirus 2 by RT PCR NEGATIVE NEGATIVE Final    Comment: (NOTE) SARS-CoV-2 target nucleic acids are NOT DETECTED.  The SARS-CoV-2 RNA is generally detectable in upper respiratoy specimens during the acute phase of infection. The lowest concentration of SARS-CoV-2 viral copies this assay can detect is 131 copies/mL. A negative result does not preclude SARS-Cov-2 infection and should not be used as the sole basis for treatment or other patient management decisions. A negative result may occur with  improper specimen collection/handling, submission of specimen other than nasopharyngeal swab, presence of viral mutation(s) within the areas targeted by this assay, and inadequate number of viral copies (<131 copies/mL). A negative result must be combined with  clinical observations, patient history, and epidemiological information. The expected result is Negative.  Fact Sheet for Patients:  https://www.moore.com/  Fact Sheet for Healthcare Providers:  https://www.young.biz/  This test is no t yet approved or cleared by the [place] FDA and  has been authorized for detection and/or diagnosis of SARS-CoV-2 by FDA under an Emergency Use Authorization (EUA). This EUA will remain  in effect (meaning this test can be used) for the duration of the COVID-19 declaration under Section 564(b)(1) of the Act, 21 U.S.C. section 360bbb-3(b)(1), unless the authorization is terminated or revoked sooner.     Influenza A by PCR NEGATIVE NEGATIVE Final   Influenza B by PCR NEGATIVE NEGATIVE Final    Comment: (NOTE) The Xpert Xpress SARS-CoV-2/FLU/RSV assay is intended as an aid in  the diagnosis of influenza from Nasopharyngeal swab specimens and  should not be used as a sole basis  for treatment. Nasal washings and  aspirates are unacceptable for Xpert Xpress SARS-CoV-2/FLU/RSV  testing.  Fact Sheet for Patients: https://www.moore.com/https://www.fda.gov/media/142436/download  Fact Sheet for Healthcare Providers: https://www.young.biz/https://www.fda.gov/media/142435/download  This test is not yet approved or cleared by the [place] FDA and  has been authorized for detection and/or diagnosis of SARS-CoV-2 by  FDA under an Emergency Use Authorization (EUA). This EUA will remain  in effect (meaning this test can be used) for the duration of the  Covid-19 declaration under Section 564(b)(1) of the Act, 21  U.S.C. section 360bbb-3(b)(1), unless the authorization is  terminated or revoked. Performed at [facility], [address] [place], [place] [address]     Time coordinating discharge: Approximately 40 minutes  Tyrone Nineyan B Melanee Cordial, MD  [facility] 09/07/2020, 2:40 PM

## 2020-09-07 NOTE — TOC Progression Note (Signed)
Transition of Care (TOC) - Progression Note    Patient Details  Name: Kristi Payne MRN: [id number] Date of Birth: 04/08/1930  Transition of Care (TOC) CM/SW Contact  Misty D Green, RN Phone Number: 09/07/2020, 1:55 PM  Clinical Narrative:   RNCM met with patient, daughter and pastor several times at bedside. After much discussion patient and daughter choose for patient to go to [facility]. RNCM accepted bed in hub and notified Kathy.     Expected Discharge Plan: Skilled Nursing Facility Barriers to Discharge: No Barriers Identified  Expected Discharge Plan and Services Expected Discharge Plan: Skilled Nursing Facility     Post Acute Care Choice: Skilled Nursing Facility Living arrangements for the past 2 months: Single Family Home Expected Discharge Date: 09/07/20                                     Social Determinants of Health (SDOH) Interventions    Readmission Risk Interventions No flowsheet data found.  

## 2020-09-07 NOTE — Plan of Care (Signed)
  Problem: Nutrition: Goal: Adequate nutrition will be maintained Outcome: Progressing   

## 2020-09-07 NOTE — Progress Notes (Signed)
Physical Therapy Treatment Patient Details Name: Kristi Payne MRN: [id number] DOB: 11/14/1930 Today's Date: 09/07/2020    History of Present Illness 86 y.o. female with medical history significant for uncontrolled type 2 diabetes, hypertension hyperlipidemia who presents with concerns of altered mental status. Blood glucose of 662 on arrival.  CT notes subacute CVA.    PT Comments    Pt presents in fowler's position on arrival to room and is agreeable to PT session. Patient reports 5.25/10 L knee pain and increases to 6/10 with activity. She was minA+1 for bed mobility as she used therapist hand for support to bring her sitting EOB. During sit<>stand transfer, pt stood from EOB without any assistance but seemed more wobbily today. She needed to use the bedrail for support at first, but quickly transitioned to BUE support on RW. She walked to the stairs and performed with CGA using bilateral rails for support and step to pattern. After finishing the stairs, patient looked unsteady needing to use BUE support on RW. Pt needed constant VCs for walker management but demonstrates step through pattern with occasional staggering of feet. Once back in room, patient sits in recliner chair and performs therapeutic exercises. Stretched L hamstring and gastroc to help ease knee pain. Pt left in chair with all needs and nurse notified about pain medication. Pt will benefit from skilled PT services to address deficits in strength, balance, and decrease risk for future falls. Discharge recommendations remains appropriate as she needs 24/7 supervision when she is mobilizing for safety. Discussed with case manager the recommendations.    Follow Up Recommendations  SNF;Supervision/Assistance - 24 hour     Equipment Recommendations  Rolling walker with 5" wheels    Recommendations for Other Services       Precautions / Restrictions Precautions Precautions: Fall Restrictions Weight Bearing Restrictions: No     Mobility  Bed Mobility Overal bed mobility: Needs Assistance Bed Mobility: Supine to Sit     Supine to sit: Min assist;HOB elevated     General bed mobility comments: Patient used therapist hand to help get her sitting EOB. VCs utilized for proper technique throughout bed mobility.   Transfers Overall transfer level: Needs assistance Equipment used: None Transfers: Sit to/from Stand Sit to Stand: Supervision         General transfer comment: Pt stood from EOB without any assistance but seemed more wobbily today. She was stabilizing with hand on bedrail and then balancing with BUE on RW.  Ambulation/Gait Ambulation/Gait assistance: Min guard Gait Distance (Feet): 280 Feet Assistive device: Rolling walker (2 wheeled) Gait Pattern/deviations: Step-to pattern;Decreased step length - right;Decreased step length - left     General Gait Details: Pt needed constant VCs for walker management but demonstrates step through pattern with occasional staggering of feet. She utilized RW for 275 feet and the other 5 feet was without any AD. She again felt more steady with the RW.   Stairs Stairs: Yes Stairs assistance: Min guard Stair Management: Two rails Number of Stairs: 5 General stair comments: Pt performed step to pattern on stairs with CGA for safety. No overt loss of balance noticed   Wheelchair Mobility    Modified Rankin (Stroke Patients Only)       Balance Overall balance assessment: Needs assistance Sitting-balance support: Feet supported;Single extremity supported Sitting balance-Leahy Scale: Good Sitting balance - Comments: Patient able to maintain good sitting balance EOB with UE supported on bedrail, but only feet supported in recliner chair   Standing balance support: During  functional activity Standing balance-Leahy Scale: Good Standing balance comment: Patient able to maintain good standing balance with BUE supported and shows no LOB.                             Cognition Arousal/Alertness: Awake/alert Behavior During Therapy: WFL for tasks assessed/performed Overall Cognitive Status: No family/caregiver present to determine baseline cognitive functioning Area of Impairment: Following commands;Safety/judgement;Awareness                               General Comments: Pt understood instructions to stay within walker, however frequent VCs utilized for proper technique with RW      Exercises General Exercises - Upper Extremity Elbow Flexion: Both;Seated;5 reps;Strengthening (light manual resistance) Elbow Extension: Strengthening;Both;5 reps;Seated (light manual resistance) General Exercises - Lower Extremity Ankle Circles/Pumps: AROM;Both;10 reps;Seated Gluteal Sets: AROM;Both;10 reps;Seated Long Arc Quad: AROM;Both;10 reps;Seated Straight Leg Raises: AROM;Both;10 reps;Seated Hip Flexion/Marching: AROM;Both;10 reps;Seated Other Exercises Other Exercises: L hamstring and gastroc stretch to help ease knee pain    General Comments General comments (skin integrity, edema, etc.): Patient has bruising on bilateral forearms but pt notes it doesn't hurt.      Pertinent Vitals/Pain Pain Assessment: 0-10 Pain Score: 5  (Increased to 6 by end of session) Pain Location: Back of knee Pain Descriptors / Indicators: Aching;Sore Pain Intervention(s): Monitored during session;Patient requesting pain meds-RN notified    Home Living                      Prior Function            PT Goals (current goals can now be found in the care plan section) Acute Rehab PT Goals Patient Stated Goal: to go home PT Goal Formulation: With patient Time For Goal Achievement: 09/19/20 Potential to Achieve Goals: Fair Progress towards PT goals: Progressing toward goals    Frequency    Min 2X/week      PT Plan Current plan remains appropriate    Co-evaluation              AM-PAC PT "6 Clicks" Mobility    Outcome Measure  Help needed turning from your back to your side while in a flat bed without using bedrails?: A Little Help needed moving from lying on your back to sitting on the side of a flat bed without using bedrails?: A Little Help needed moving to and from a bed to a chair (including a wheelchair)?: A Little Help needed standing up from a chair using your arms (e.g., wheelchair or bedside chair)?: A Little Help needed to walk in hospital room?: A Little Help needed climbing 3-5 steps with a railing? : A Lot 6 Click Score: 17    End of Session Equipment Utilized During Treatment: Gait belt Activity Tolerance: Patient tolerated treatment well Patient left: in chair;with call bell/phone within reach;with chair alarm set Nurse Communication: Mobility status;Patient requests pain meds PT Visit Diagnosis: Muscle weakness (generalized) (M62.81);Difficulty in walking, not elsewhere classified (R26.2)     Time: 6759-1638 PT Time Calculation (min) (ACUTE ONLY): 38 min  Charges:                        Katherine Basset, SPT Baker Pierini 09/07/2020, 12:46 PM

## 2020-09-08 LAB — GLUCOSE, CAPILLARY
Glucose-Capillary: 140 mg/dL — ABNORMAL HIGH (ref 70–99)
Glucose-Capillary: 189 mg/dL — ABNORMAL HIGH (ref 70–99)
Glucose-Capillary: 225 mg/dL — ABNORMAL HIGH (ref 70–99)
Glucose-Capillary: 363 mg/dL — ABNORMAL HIGH (ref 70–99)

## 2020-09-08 MED ORDER — IBUPROFEN 400 MG PO TABS
800.0000 mg | ORAL_TABLET | Freq: Once | ORAL | Status: AC
  Administered 2020-09-08: 800 mg via ORAL
  Filled 2020-09-08: qty 2

## 2020-09-08 NOTE — Progress Notes (Signed)
Spoke to RN regarding consult for IV. Noted only IV medication is prn D50; blood sugar currently in 200 range. RN states will message MD regarding if IV access is still needed. VAST available for any further consults.

## 2020-09-08 NOTE — Discharge Summary (Signed)
Discharge Summary  Laury Huizar VPX:[id number] DOB: 07-Jan-1930  PCP: Jerl Mina, MD  Admit date: 09/04/2020 Discharge date: 09/08/2020  Time spent: 35 minutes.  Recommendations for Outpatient Follow-up:  1. Follow-up with primary care provider in 1 to 2 weeks 2. Take your medications as prescribed 3. Continue PT OT with assistance and fall precautions  Discharge Diagnoses:  Active Hospital Problems   Diagnosis Date Noted  . DM2 (diabetes mellitus, type 2) (HCC) 09/05/2020  . Hyperglycemia due to diabetes mellitus (HCC) 09/04/2020    Resolved Hospital Problems  No resolved problems to display.    Discharge Condition: Stable  Diet recommendation: Heart healthy carb modified diet  Vitals:   09/08/20 0032 09/08/20 0801  BP: (!) 118/59 119/68  Pulse: 79 78  Resp: 20 15  Temp: 99.7 F (37.6 C) 98.6 F (37 C)  SpO2: 95% 97%    History of present illness:  Don Perking [phone] y.o.femalewith a history of uncontrolled T2DM, HTN, HLD who brought to the ED by her pastor 09/04/2020 with AMS with several days of dizziness. She was found to be in mild DKA with glucose 662, anion gap 16, pH 7.29 and with AKI (Cr 1.10). IV insulin was started and the patient was admitted, ultimately transitioning to basal-bolus dosing. Hemoglobin A1c was 15.5%. Due to safety concerns and need for rehabilitation, SNF placement is pursued.  09/08/20: Seen with her daughter present at bedside.  No acute events overnight.  She has no new complaints.   Hospital Course:  Active Problems:   Hyperglycemia due to diabetes mellitus (HCC)   DM2 (diabetes mellitus, type 2) (HCC)  Mild DKA in poorly-controlled T2DM: HbA1c 15.5%. We need to start long-acting insulin at discharge and follow up with PCP for ability to administer and consideration of bolus-dosing addition.  - Recommend close CBG monitoring and education. Lantus 12u + sensitive SSI TIDWC and at HS coverage recommended with continued  titration. - With CrCl <50ml/min, will stop metformin. - Carb-modified diet  AKI on stage IIIb CKD (based on available lab results): Resolved.  - Avoid nephrotoxins  HTN:  - Continue home medications  HLD:  - Continue statin  Acute metabolic encephalopathy: Likely related to metabolic derangements, has improved.   Subacute or chronic left basal ganglia lacunar infarct as per CT scan. - Risk factor modification, specifically improved DM and HTN control recommended. - Start aspirin 81mg , continue statin. - Needs outpatient neurology follow up.    Procedures:  None.  Consultations:  None.  Discharge Exam: BP 119/68 (BP Location: Right Arm)   Pulse 78   Temp 98.6 F (37 C) (Oral)   Resp 15   Ht 5' (1.524 m)   Wt 47.6 kg   SpO2 97%   BMI 20.51 kg/m  . General: 86 y.o. year-old female well developed well nourished in no acute distress.  Alert and oriented x3. . Cardiovascular: Regular rate and rhythm with no rubs or gallops.  No thyromegaly or JVD noted.   98 Respiratory: Clear to auscultation with no wheezes or rales. Good inspiratory effort. . Abdomen: Soft nontender nondistended with normal bowel sounds x4 quadrants. . Musculoskeletal: No lower extremity edema. 2/4 pulses in all 4 extremities. Marland Kitchen Psychiatry: Mood is appropriate for condition and setting  Discharge Instructions You were cared for by a hospitalist during your hospital stay. If you have any questions about your discharge medications or the care you received while you were in the hospital after you are discharged, you can call the unit and  asked to speak with the hospitalist on call if the hospitalist that took care of you is not available. Once you are discharged, your primary care physician will handle any further medical issues. Please note that NO REFILLS for any discharge medications will be authorized once you are discharged, as it is imperative that you return to your primary care physician (or  establish a relationship with a primary care physician if you do not have one) for your aftercare needs so that they can reassess your need for medications and monitor your lab values.   Allergies as of 09/08/2020      Reactions   Penicillins    Other reaction(s): Unknown      Medication List    STOP taking these medications   metFORMIN 1000 MG tablet Commonly known as: GLUCOPHAGE     TAKE these medications   amLODipine 5 MG tablet Commonly known as: NORVASC Take 5 mg by mouth daily.   aspirin EC 81 MG tablet Take 1 tablet (81 mg total) by mouth daily.   hydrochlorothiazide 25 MG tablet Commonly known as: HYDRODIURIL Take 25 mg by mouth daily.   insulin aspart 100 UNIT/ML injection Commonly known as: novoLOG Inject 0-9 Units into the skin 3 (three) times daily with meals.   insulin glargine 100 UNIT/ML injection Commonly known as: LANTUS Inject 0.12 mLs (12 Units total) into the skin daily.   lisinopril 40 MG tablet Commonly known as: ZESTRIL Take 40 mg by mouth daily.   simvastatin 20 MG tablet Commonly known as: ZOCOR Take 20 mg by mouth daily.      Allergies  Allergen Reactions  . Penicillins     Other reaction(s): Unknown    Contact information for follow-up providers    Jerl Mina, MD. Schedule an appointment as soon as possible for a visit.   Specialty: Family Medicine Contact information: [address] [facility] [place] [address] [phone]            Contact information for after-discharge care    Destination    [facility] .   Service: Skilled Nursing Contact information: [address] [place] [address] [phone]                   The results of significant diagnostics from this hospitalization (including imaging, microbiology, ancillary and laboratory) are listed below for reference.    Significant Diagnostic Studies: DG Chest 2 View  Result Date:  09/04/2020 CLINICAL DATA:  Shortness of breath. Additional history provided: Increasing confusion. EXAM: CHEST - 2 VIEW COMPARISON:  Report from prior chest radiographs 11/23/2018 (images unavailable). Chest radiographs 09/19/2012. FINDINGS: Heart size within normal limits.  Aortic atherosclerosis. New from the prior examination of 09/19/2012, there is a subcentimeter nodular opacity which projects in the region of the right lung apex. There is no appreciable airspace consolidation or pulmonary edema. No evidence of pleural effusion or pneumothorax. No acute bony abnormality identified.  Thoracic spondylosis IMPRESSION: A subcentimeter nodular opacity projects in the region of the right lung apex. Nonemergent chest CT is recommended to further assess for a possible pulmonary nodule at this site. No appreciable airspace consolidation or pulmonary edema. Aortic Atherosclerosis (ICD10-I70.0). Electronically Signed   By: Jackey Loge DO   On: 09/04/2020 12:43   CT Head Wo Contrast  Result Date: 09/04/2020 CLINICAL DATA:  Mental status change, unknown cause. Additional history provided: Confusion x1 week. EXAM: CT HEAD WITHOUT CONTRAST TECHNIQUE: Contiguous axial images  were obtained from the base of the skull through the vertex without intravenous contrast. COMPARISON:  No pertinent prior exams are available for comparison. FINDINGS: Brain: Mild generalized parenchymal atrophy of the brain. Lacunar infarct within the left basal ganglia which is subacute or chronic in appearance (series 2, image 10) (series 4, image 26). Background mild ill-defined hypoattenuation within the cerebral white matter is nonspecific, but consistent with chronic small vessel ischemic disease. There is no acute intracranial hemorrhage. No demarcated cortical infarct. No extra-axial fluid collection. No evidence of intracranial mass. No midline shift. Vascular: No hyperdense vessel.  Atherosclerotic calcifications. Skull: Normal. Negative  for fracture or focal lesion. Sinuses/Orbits: Visualized orbits show no acute finding. No significant paranasal sinus disease or mastoid effusion at the imaged levels. IMPRESSION: There is a lacunar infarct within the left basal ganglia which is subacute or chronic in appearance. Background mild generalized atrophy of the brain and chronic small vessel ischemic disease. Electronically Signed   By: Jackey LogeKyle  Golden DO   On: 09/04/2020 12:48    Microbiology: Recent Results (from the past 240 hour(s))  Respiratory Panel by RT PCR (Flu A&B, Covid) - Nasopharyngeal Swab     Status: None   Collection Time: 09/04/20  5:33 PM   Specimen: Nasopharyngeal Swab  Result Value Ref Range Status   SARS Coronavirus 2 by RT PCR NEGATIVE NEGATIVE Final    Comment: (NOTE) SARS-CoV-2 target nucleic acids are NOT DETECTED.  The SARS-CoV-2 RNA is generally detectable in upper respiratoy specimens during the acute phase of infection. The lowest concentration of SARS-CoV-2 viral copies this assay can detect is 131 copies/mL. A negative result does not preclude SARS-Cov-2 infection and should not be used as the sole basis for treatment or other patient management decisions. A negative result may occur with  improper specimen collection/handling, submission of specimen other than nasopharyngeal swab, presence of viral mutation(s) within the areas targeted by this assay, and inadequate number of viral copies (<131 copies/mL). A negative result must be combined with clinical observations, patient history, and epidemiological information. The expected result is Negative.  Fact Sheet for Patients:  https://www.moore.com/https://www.fda.gov/media/142436/download  Fact Sheet for Healthcare Providers:  https://www.young.biz/https://www.fda.gov/media/142435/download  This test is no t yet approved or cleared by the [place] FDA and  has been authorized for detection and/or diagnosis of SARS-CoV-2 by FDA under an Emergency Use Authorization (EUA). This EUA  will remain  in effect (meaning this test can be used) for the duration of the COVID-19 declaration under Section 564(b)(1) of the Act, 21 U.S.C. section 360bbb-3(b)(1), unless the authorization is terminated or revoked sooner.     Influenza A by PCR NEGATIVE NEGATIVE Final   Influenza B by PCR NEGATIVE NEGATIVE Final    Comment: (NOTE) The Xpert Xpress SARS-CoV-2/FLU/RSV assay is intended as an aid in  the diagnosis of influenza from Nasopharyngeal swab specimens and  should not be used as a sole basis for treatment. Nasal washings and  aspirates are unacceptable for Xpert Xpress SARS-CoV-2/FLU/RSV  testing.  Fact Sheet for Patients: https://www.moore.com/https://www.fda.gov/media/142436/download  Fact Sheet for Healthcare Providers: https://www.young.biz/https://www.fda.gov/media/142435/download  This test is not yet approved or cleared by the [place] FDA and  has been authorized for detection and/or diagnosis of SARS-CoV-2 by  FDA under an Emergency Use Authorization (EUA). This EUA will remain  in effect (meaning this test can be used) for the duration of the  Covid-19 declaration under Section 564(b)(1) of the Act, 21  U.S.C. section 360bbb-3(b)(1), unless the authorization is  terminated  or revoked. Performed at [facility], [address] [place], [place] [address]   SARS Coronavirus 2 by RT PCR (hospital order, performed in [facility] hospital lab) Nasopharyngeal Nasopharyngeal Swab     Status: None   Collection Time: 09/07/20  3:44 PM   Specimen: Nasopharyngeal Swab  Result Value Ref Range Status   SARS Coronavirus 2 NEGATIVE NEGATIVE Final    Comment: (NOTE) SARS-CoV-2 target nucleic acids are NOT DETECTED.  The SARS-CoV-2 RNA is generally detectable in upper and lower respiratory specimens during the acute phase of infection. The lowest concentration of SARS-CoV-2 viral copies this assay can detect is 250 copies / mL. A negative result does not preclude SARS-CoV-2 infection and should  not be used as the sole basis for treatment or other patient management decisions.  A negative result may occur with improper specimen collection / handling, submission of specimen other than nasopharyngeal swab, presence of viral mutation(s) within the areas targeted by this assay, and inadequate number of viral copies (<250 copies / mL). A negative result must be combined with clinical observations, patient history, and epidemiological information.  Fact Sheet for Patients:   BoilerBrush.com.cy  Fact Sheet for Healthcare Providers: https://pope.com/  This test is not yet approved or  cleared by the [place] FDA and has been authorized for detection and/or diagnosis of SARS-CoV-2 by FDA under an Emergency Use Authorization (EUA).  This EUA will remain in effect (meaning this test can be used) for the duration of the COVID-19 declaration under Section 564(b)(1) of the Act, 21 U.S.C. section 360bbb-3(b)(1), unless the authorization is terminated or revoked sooner.  Performed at [facility], [address] [place], [place] [address]      Labs: Basic Metabolic Panel: Recent Labs  Lab 09/04/20 1133 09/04/20 1145 09/04/20 2042 09/05/20 0408 09/06/20 0450  NA 124*  --  135 133* 133*  K 5.4*  --  3.5 3.8 4.0  CL 87*  --  97* 98 98  CO2 21*  --  22 27 25   GLUCOSE 662*  --  102* 149* 199*  BUN 31*  --  20 16 16   CREATININE 1.10*  --  0.62 0.59 0.70  CALCIUM 9.6  --  8.8* 8.6* 8.5*  MG  --  2.2  --   --   --    Liver Function Tests: Recent Labs  Lab 09/04/20 1133  AST 16  ALT 15  ALKPHOS 80  BILITOT 1.1  PROT 7.2  ALBUMIN 4.1   No results for input(s): LIPASE, AMYLASE in the last 168 hours. No results for input(s): AMMONIA in the last 168 hours. CBC: Recent Labs  Lab 09/04/20 1133 09/06/20 0450  WBC 5.8 4.9  HGB 11.7* 11.2*  HCT 34.5* 33.3*  MCV 87.6 88.3  PLT 241 193   Cardiac Enzymes: No  results for input(s): CKTOTAL, CKMB, CKMBINDEX, TROPONINI in the last 168 hours. BNP: BNP (last 3 results) No results for input(s): BNP in the last 8760 hours.  ProBNP (last 3 results) No results for input(s): PROBNP in the last 8760 hours.  CBG: Recent Labs  Lab 09/07/20 1706 09/07/20 2107 09/08/20 0006 09/08/20 0404 09/08/20 0802  GLUCAP 270* 248* 225* 140* 189*       Signed:  09/10/20, MD [facility] 09/08/2020, 10:58 AM

## 2020-09-08 NOTE — Progress Notes (Signed)
Physical Therapy Treatment Patient Details Name: Kristi Payne MRN: [id number] DOB: Apr 05, 1930 Today's Date: 09/08/2020    History of Present Illness 86 y.o. female with medical history significant for uncontrolled type 2 diabetes, hypertension hyperlipidemia who presents with concerns of altered mental status. Blood glucose of 662 on arrival.  CT notes subacute CVA.    PT Comments    Pt presents in fowler's position on arrival to room with daughter bedside and is agreeable to PT session. She notes 5/10 pain/stiffness in L knee. She continues to be minA+1 bed mobility and supervision for sit<>stand transfer. Patient performed transfer 4 times due to cleaning up floor, changing briefs and socks after having an accident. Once everything was cleaned up, pt walked 4 times around nurses station without any overt LOB or rest breaks. She walked 10 feet in 9.30 seconds, indicating household ambulator. VCs cues still utilized for proper RW management especially around turns. Once in her room, pt performed 5TSTS in 17.39 seconds indicating decreased power, endurance, and increased risk for falls as normative value is 14 seconds for her age range. Pt left in chair with all needs with daughter bedside and nurse in room. Pt will benefit from skilled PT services to address deficits in strength, balance,and decrease risk for future falls.Discharge recommendations remains appropriate as she needs 24/7 supervision when she is mobilizing for safety.    Follow Up Recommendations  SNF;Supervision/Assistance - 24 hour     Equipment Recommendations  Rolling walker with 5" wheels    Recommendations for Other Services       Precautions / Restrictions Precautions Precautions: Fall Restrictions Weight Bearing Restrictions: No    Mobility  Bed Mobility Overal bed mobility: Needs Assistance Bed Mobility: Supine to Sit     Supine to sit: Min assist;HOB elevated     General bed mobility comments: Patient  used therapist hand to help get her sitting EOB. VCs utilized for proper technique throughout bed mobility.   Transfers Overall transfer level: Needs assistance Equipment used: None Transfers: Sit to/from Stand Sit to Stand: Supervision         General transfer comment: Pt stood from EOB without any assistance. She used hand on bedrail and then balancing with BUE on RW. Patient performed transfer 4 times due to cleaning up floor, changing briefs and socks after having an accident  Ambulation/Gait Ambulation/Gait assistance: Min guard Gait Distance (Feet): 830 Feet Assistive device: Rolling walker (2 wheeled) Gait Pattern/deviations: Step-through pattern Gait velocity: She walked 10 feet in 9.30 seconds, 1.07 feet/sec Gait velocity interpretation: <1.31 ft/sec, indicative of household ambulator General Gait Details: Pt needed constant VCs for walker management especially during turns. She reports decreased gait speed from baseline. No overt LOB observed. No rest breaks needed throughout walking distance.   Stairs         General stair comments: Did not perform stairs this session   Wheelchair Mobility    Modified Rankin (Stroke Patients Only)       Balance Overall balance assessment: Independent Sitting-balance support: Feet unsupported;No upper extremity supported Sitting balance-Leahy Scale: Good Sitting balance - Comments: Patient able to maintain good sitting balance EOB and in recliner chair without any support   Standing balance support: During functional activity Standing balance-Leahy Scale: Good Standing balance comment: Patient able to maintain good standing balance with BUE supported and shows no LOB.  Cognition Arousal/Alertness: Awake/alert Behavior During Therapy: WFL for tasks assessed/performed   Area of Impairment: Following commands;Safety/judgement;Awareness                          Safety/Judgement: Decreased awareness of safety;Decreased awareness of deficits     General Comments: Pt understood instructions to stay within walker, however frequent VCs utilized for proper technique with RW      Exercises General Exercises - Lower Extremity Ankle Circles/Pumps: AROM;Both;10 reps;Seated Long Arc Quad: AROM;Both;10 reps;Seated Hip Flexion/Marching: AROM;Both;10 reps;Seated Other Exercises Other Exercises: Pt performed 5TSTS in 17.39 seconds indicating decreased power, endurance, and increased risk for falls    General Comments General comments (skin integrity, edema, etc.): Patient has bruising on bilateral forearms but pt notes it doesn't hurt.      Pertinent Vitals/Pain Pain Assessment: 0-10 Pain Score: 5  Pain Location: Knee Pain Descriptors / Indicators: Aching;Sore Pain Intervention(s): Monitored during session;Patient requesting pain meds-RN notified    Home Living                      Prior Function            PT Goals (current goals can now be found in the care plan section) Acute Rehab PT Goals Patient Stated Goal: to go home PT Goal Formulation: With patient Time For Goal Achievement: 09/19/20 Potential to Achieve Goals: Fair Progress towards PT goals: Progressing toward goals    Frequency    Min 2X/week      PT Plan Current plan remains appropriate    Co-evaluation              AM-PAC PT "6 Clicks" Mobility   Outcome Measure  Help needed turning from your back to your side while in a flat bed without using bedrails?: A Little Help needed moving from lying on your back to sitting on the side of a flat bed without using bedrails?: A Little Help needed moving to and from a bed to a chair (including a wheelchair)?: A Little Help needed standing up from a chair using your arms (e.g., wheelchair or bedside chair)?: A Little Help needed to walk in hospital room?: A Little Help needed climbing 3-5 steps with a railing?  : A Lot 6 Click Score: 17    End of Session Equipment Utilized During Treatment: Gait belt Activity Tolerance: Patient tolerated treatment well Patient left: in chair;with call bell/phone within reach;with chair alarm set;with family/visitor present;with nursing/sitter in room Nurse Communication: Mobility status;Patient requests pain meds PT Visit Diagnosis: Muscle weakness (generalized) (M62.81);Difficulty in walking, not elsewhere classified (R26.2)     Time: 8502-7741 PT Time Calculation (min) (ACUTE ONLY): 45 min  Charges:                         Katherine Basset, SPT Baker Pierini 09/08/2020, 12:39 PM

## 2020-09-08 NOTE — Plan of Care (Signed)

## 2020-09-08 NOTE — Care Management Important Message (Signed)
Important Message  Patient Details  Name: Kristi Payne MRN: [id number] Date of Birth: [birth year]   Medicare Important Message Given:  N/A - LOS <3 / Initial given by admissions  Initial Medicare IM reviewed with daughter, Kristi Payne by Jennye Moccasin, Patient Access Associate on 09/07/2020 at 11:23am.   Johnell Comings 09/08/2020, 8:39 AM

## 2021-06-14 IMAGING — CT CT HEAD W/O CM
3 series · 15 of 43 positions shown, 18 images · non-contrast
Comparison: No pertinent prior exams are available for comparison.

CLINICAL DATA: Mental status change, unknown cause. Additional
history provided: Confusion x1 week.

EXAM:
CT HEAD WITHOUT CONTRAST
TECHNIQUE: Contiguous axial images were obtained from the base of the skull
through the vertex without intravenous contrast.

[Series 2: head wo · axial · 0.39mm/px · z∈[-113,-8]mm · 9 of 26 slices shown, 12 images]
[im 3/26  brain]
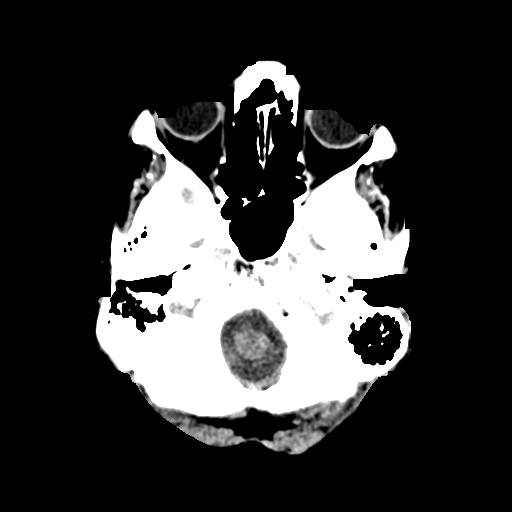
[im 3/26  bone]
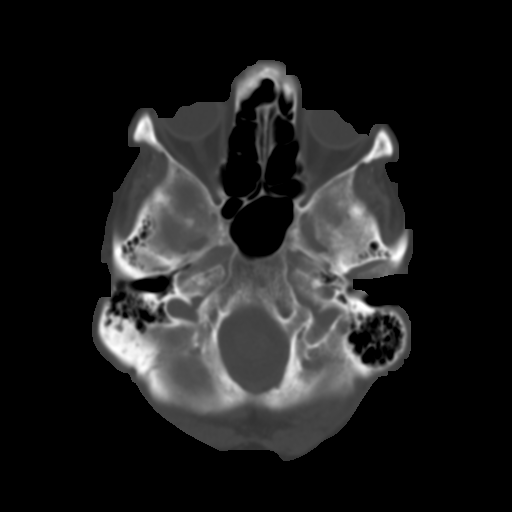
[im 6/26  brain]
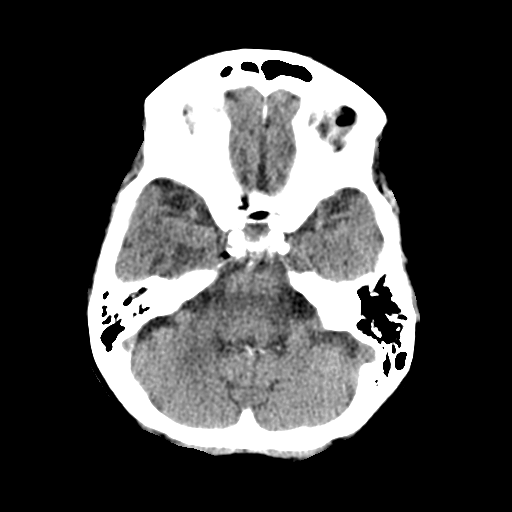
[im 8/26  brain]
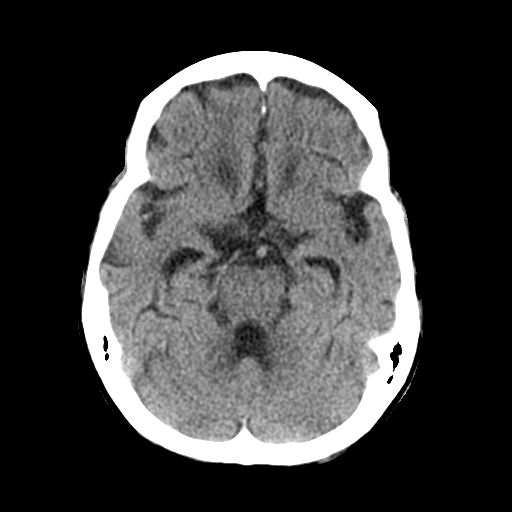
[im 11/26  brain]
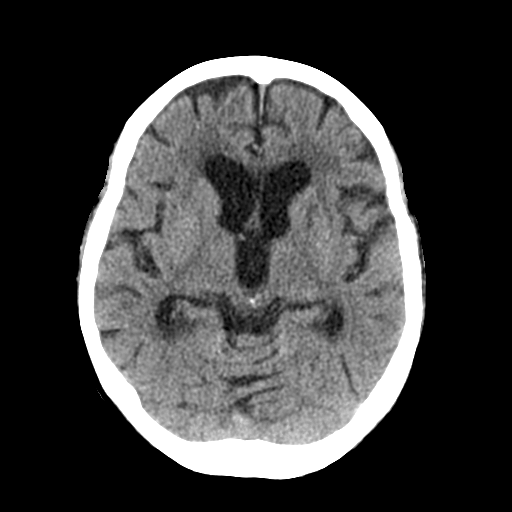
[im 14/26  brain]
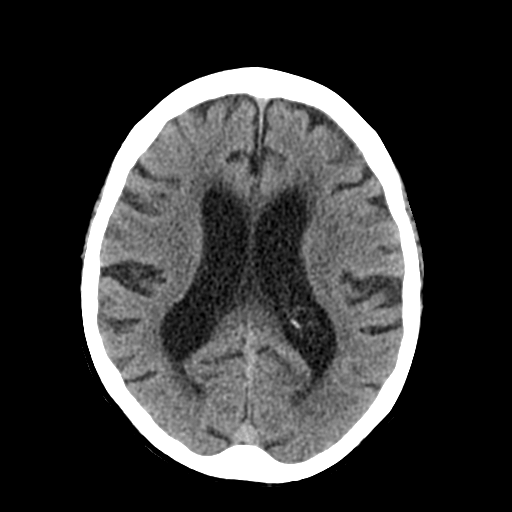
[im 14/26  bone]
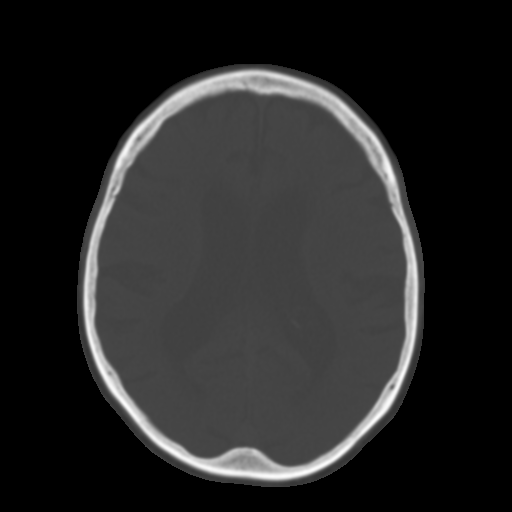
[im 16/26  brain]
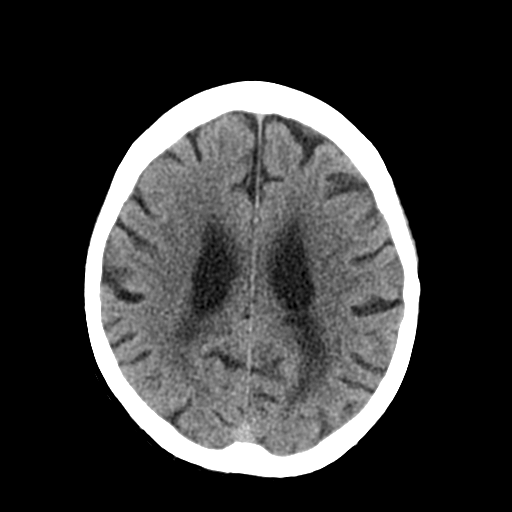
[im 19/26  brain]
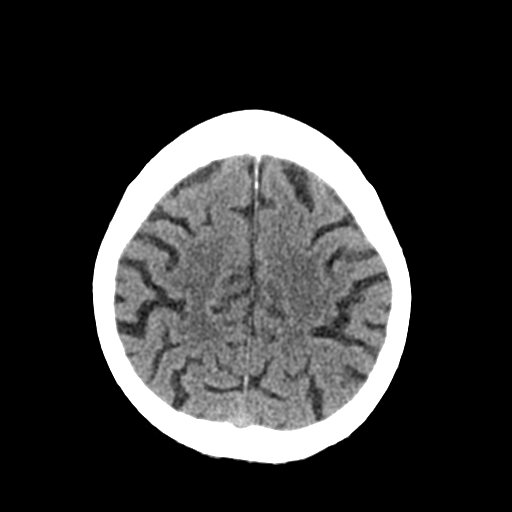
[im 22/26  brain]
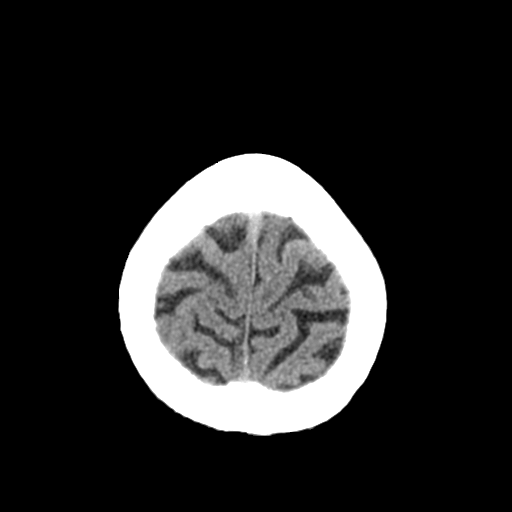
[im 24/26  brain]
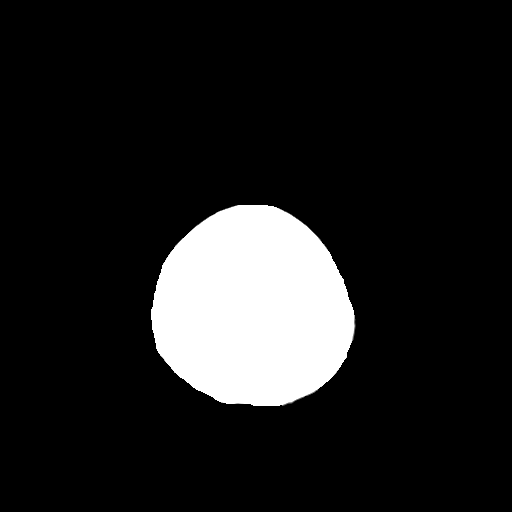
[im 24/26  bone]
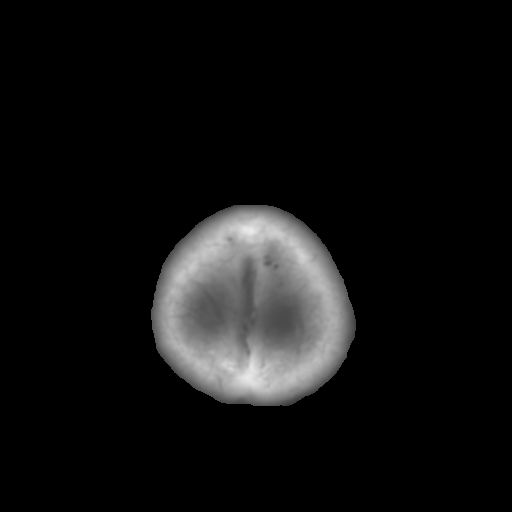

[Series 4: coronal soft tissue · coronal · 0.27mm/px · 3 of 60 slices shown]
[im 20/60  brain]
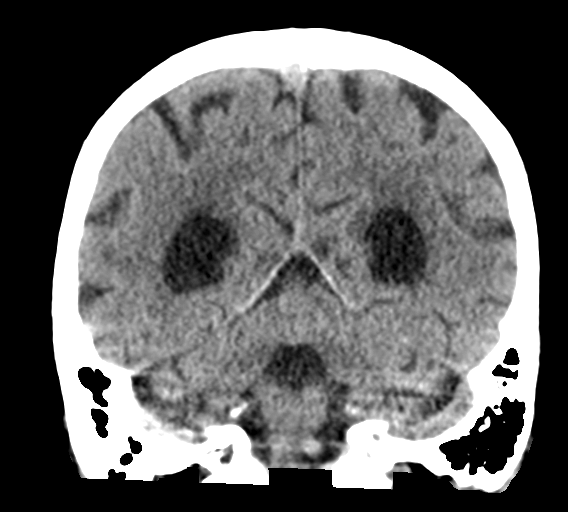
[im 27/60  brain]
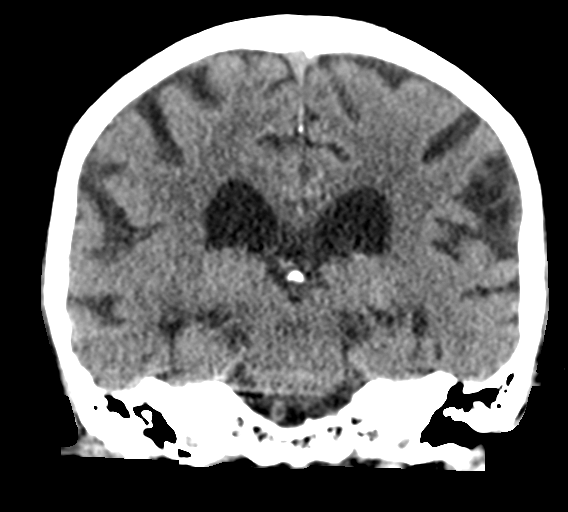
[im 33/60  brain]
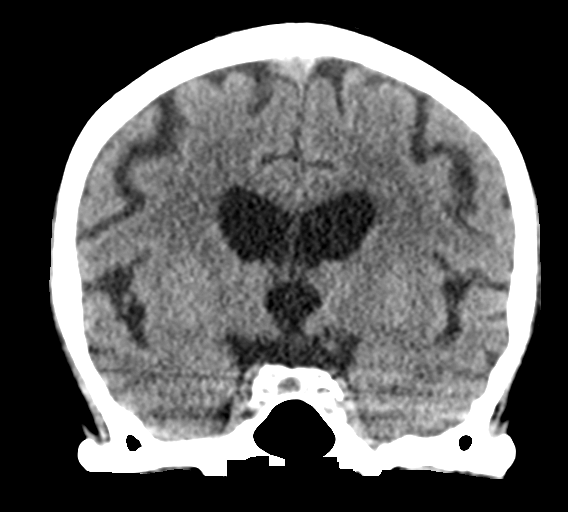

[Series 5: sagittal soft tissue · sagittal · 0.27mm/px · 3 of 50 slices shown]
[im 17/50  brain]
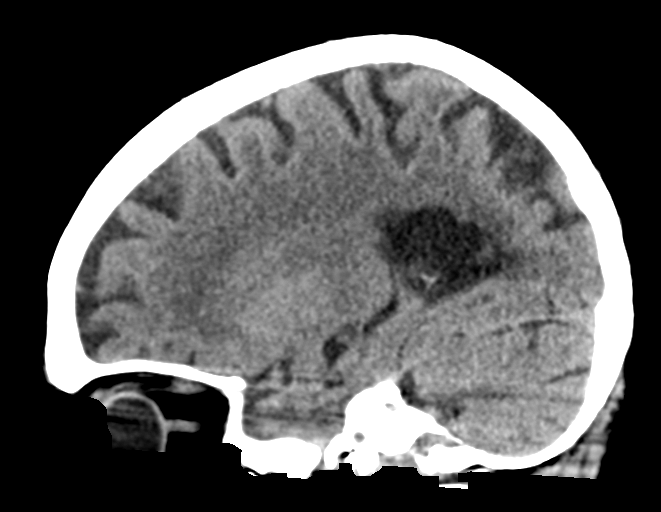
[im 25/50  brain]
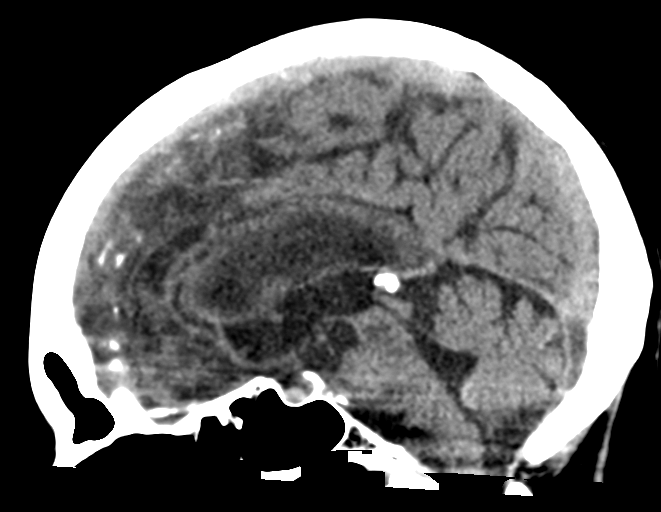
[im 33/50  brain]
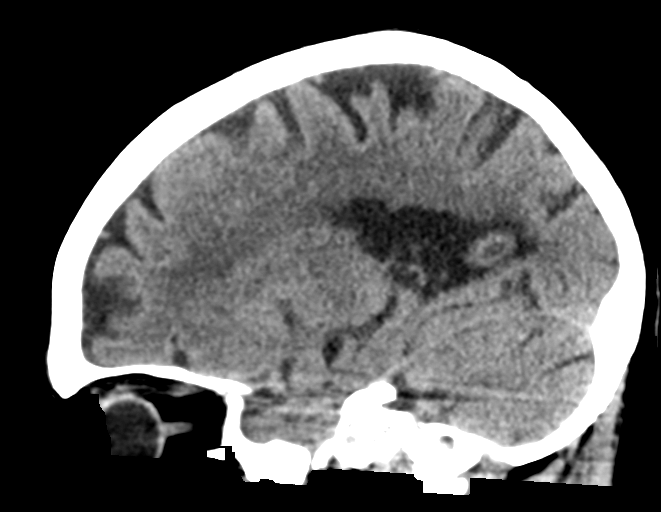

[15 of 43 positions shown; findings below may reference images not displayed]

FINDINGS: Brain:

Mild generalized parenchymal atrophy of the brain.

Lacunar infarct within the left basal ganglia which is subacute or
chronic in appearance (series 2, image 10) (series 4, image 26).

Background mild ill-defined hypoattenuation within the cerebral
white matter is nonspecific, but consistent with chronic small
vessel ischemic disease.

There is no acute intracranial hemorrhage.

No demarcated cortical infarct.

No extra-axial fluid collection.

No evidence of intracranial mass.

No midline shift.

Vascular: No hyperdense vessel.  Atherosclerotic calcifications.

Skull: Normal. Negative for fracture or focal lesion.

Sinuses/Orbits: Visualized orbits show no acute finding. No
significant paranasal sinus disease or mastoid effusion at the
imaged levels.
IMPRESSION: There is a lacunar infarct within the left basal ganglia which is
subacute or chronic in appearance.

Background mild generalized atrophy of the brain and chronic small
vessel ischemic disease.
